# Patient Record
Sex: Female | Born: 1975
Health system: Southern US, Community
[De-identification: ages and names within clinical notes are randomized; demographics above are authoritative.]

## PROBLEM LIST (undated history)

## (undated) ENCOUNTER — Ambulatory Visit: Source: Home / Self Care

## (undated) DIAGNOSIS — N879 Dysplasia of cervix uteri, unspecified: Secondary | ICD-10-CM

## (undated) DIAGNOSIS — J45909 Unspecified asthma, uncomplicated: Secondary | ICD-10-CM

## (undated) DIAGNOSIS — R87629 Unspecified abnormal cytological findings in specimens from vagina: Secondary | ICD-10-CM

## (undated) HISTORY — PX: LEEP: SHX91

## (undated) HISTORY — DX: Unspecified abnormal cytological findings in specimens from vagina: R87.629

## (undated) HISTORY — DX: Dysplasia of cervix uteri, unspecified: N87.9

---

## 2010-04-24 ENCOUNTER — Inpatient Hospital Stay (HOSPITAL_COMMUNITY)
Admission: AD | Admit: 2010-04-24 | Discharge: 2010-04-24 | Disposition: A | Discharge status: Home / Self Care | Payer: Self-pay | Source: Ambulatory Visit | Attending: Obstetrics and Gynecology | Admitting: Obstetrics and Gynecology

## 2010-04-24 DIAGNOSIS — J069 Acute upper respiratory infection, unspecified: Secondary | ICD-10-CM

## 2010-04-24 DIAGNOSIS — O9989 Other specified diseases and conditions complicating pregnancy, childbirth and the puerperium: Secondary | ICD-10-CM

## 2010-04-24 DIAGNOSIS — O99891 Other specified diseases and conditions complicating pregnancy: Secondary | ICD-10-CM | POA: Insufficient documentation

## 2010-04-24 DIAGNOSIS — R109 Unspecified abdominal pain: Secondary | ICD-10-CM

## 2010-04-24 LAB — URINALYSIS, ROUTINE W REFLEX MICROSCOPIC
Bilirubin Urine: NEGATIVE
Ketones, ur: NEGATIVE mg/dL
Protein, ur: NEGATIVE mg/dL
Urine Glucose, Fasting: NEGATIVE mg/dL

## 2010-05-08 ENCOUNTER — Other Ambulatory Visit: Payer: Self-pay | Admitting: Family Medicine

## 2010-05-08 DIAGNOSIS — Z3689 Encounter for other specified antenatal screening: Secondary | ICD-10-CM

## 2010-05-09 ENCOUNTER — Ambulatory Visit (HOSPITAL_COMMUNITY)
Admission: RE | Admit: 2010-05-09 | Discharge: 2010-05-09 | Disposition: A | Discharge status: Home / Self Care | Payer: Self-pay | Source: Ambulatory Visit | Attending: Family Medicine | Admitting: Family Medicine

## 2010-05-09 DIAGNOSIS — O0930 Supervision of pregnancy with insufficient antenatal care, unspecified trimester: Secondary | ICD-10-CM | POA: Insufficient documentation

## 2010-05-09 DIAGNOSIS — Z3689 Encounter for other specified antenatal screening: Secondary | ICD-10-CM | POA: Insufficient documentation

## 2010-06-05 ENCOUNTER — Other Ambulatory Visit: Payer: Self-pay | Admitting: Family Medicine

## 2010-06-05 DIAGNOSIS — Z1389 Encounter for screening for other disorder: Secondary | ICD-10-CM

## 2010-06-06 ENCOUNTER — Ambulatory Visit (HOSPITAL_COMMUNITY)
Admission: RE | Admit: 2010-06-06 | Discharge: 2010-06-06 | Disposition: A | Discharge status: Home / Self Care | Payer: Medicaid Other | Source: Ambulatory Visit | Attending: Family Medicine | Admitting: Family Medicine

## 2010-06-06 DIAGNOSIS — Z3689 Encounter for other specified antenatal screening: Secondary | ICD-10-CM | POA: Insufficient documentation

## 2010-06-06 DIAGNOSIS — Z1389 Encounter for screening for other disorder: Secondary | ICD-10-CM

## 2010-06-06 DIAGNOSIS — O365990 Maternal care for other known or suspected poor fetal growth, unspecified trimester, not applicable or unspecified: Secondary | ICD-10-CM | POA: Insufficient documentation

## 2010-08-30 ENCOUNTER — Other Ambulatory Visit: Payer: Self-pay | Admitting: Family Medicine

## 2010-08-30 DIAGNOSIS — O480 Post-term pregnancy: Secondary | ICD-10-CM

## 2010-09-03 ENCOUNTER — Other Ambulatory Visit (HOSPITAL_COMMUNITY): Payer: Medicaid Other

## 2010-09-03 ENCOUNTER — Ambulatory Visit (HOSPITAL_COMMUNITY)
Admission: RE | Admit: 2010-09-03 | Discharge: 2010-09-03 | Disposition: A | Discharge status: Home / Self Care | Payer: Self-pay | Source: Ambulatory Visit | Attending: Family Medicine | Admitting: Family Medicine

## 2010-09-03 DIAGNOSIS — Z3689 Encounter for other specified antenatal screening: Secondary | ICD-10-CM | POA: Insufficient documentation

## 2010-09-03 DIAGNOSIS — O480 Post-term pregnancy: Secondary | ICD-10-CM | POA: Insufficient documentation

## 2010-09-04 ENCOUNTER — Inpatient Hospital Stay (HOSPITAL_COMMUNITY)
Admission: AD | Admit: 2010-09-04 | Discharge: 2010-09-06 | DRG: 775 | Disposition: A | Discharge status: Home / Self Care | Payer: Medicaid Other | Source: Ambulatory Visit | Attending: Family Medicine | Admitting: Family Medicine

## 2010-09-04 LAB — RPR: RPR Ser Ql: NONREACTIVE

## 2010-09-04 LAB — CBC
HCT: 36.7 % (ref 36.0–46.0)
Hemoglobin: 12.7 g/dL (ref 12.0–15.0)
MCV: 83.6 fL (ref 78.0–100.0)
RBC: 4.39 MIL/uL (ref 3.87–5.11)
WBC: 12.5 10*3/uL — ABNORMAL HIGH (ref 4.0–10.5)

## 2010-09-22 ENCOUNTER — Inpatient Hospital Stay (HOSPITAL_COMMUNITY): Admission: AD | Admit: 2010-09-22 | Payer: Self-pay | Source: Ambulatory Visit | Admitting: Obstetrics & Gynecology

## 2013-07-01 ENCOUNTER — Encounter: Payer: Self-pay | Admitting: *Deleted

## 2013-07-29 ENCOUNTER — Ambulatory Visit (INDEPENDENT_AMBULATORY_CARE_PROVIDER_SITE_OTHER): Payer: Self-pay | Admitting: Family Medicine

## 2013-07-29 ENCOUNTER — Encounter: Payer: Self-pay | Admitting: Family Medicine

## 2013-07-29 ENCOUNTER — Encounter: Payer: Medicaid Other | Admitting: Family Medicine

## 2013-07-29 ENCOUNTER — Other Ambulatory Visit (HOSPITAL_COMMUNITY)
Admission: RE | Admit: 2013-07-29 | Discharge: 2013-07-29 | Disposition: A | Discharge status: Home / Self Care | Payer: Self-pay | Source: Ambulatory Visit | Attending: Family Medicine | Admitting: Family Medicine

## 2013-07-29 VITALS — BP 107/68 | HR 72 | Temp 97.4°F | Ht 60.0 in | Wt 141.0 lb

## 2013-07-29 DIAGNOSIS — Z3202 Encounter for pregnancy test, result negative: Secondary | ICD-10-CM | POA: Insufficient documentation

## 2013-07-29 DIAGNOSIS — N871 Moderate cervical dysplasia: Secondary | ICD-10-CM | POA: Insufficient documentation

## 2013-07-29 LAB — POCT PREGNANCY, URINE: Preg Test, Ur: NEGATIVE

## 2013-07-29 NOTE — Patient Instructions (Signed)
Conizacin del cuello uterino - Cuidados posteriores (Conization of the Cervix, Care After) Siga estas instrucciones durante las prximas semanas. Estas indicaciones le proporcionan informacin general acerca de cmo deber cuidarse despus del procedimiento. El mdico tambin podr darle instrucciones ms especficas. El tratamiento se ha planificado de acuerdo a las prcticas mdicas actuales, pero a veces se producen problemas. Comunquese con el mdico si tiene algn problema o tiene dudas despus del procedimiento. QU ESPERAR DESPUS DEL PROCEDIMIENTO Despus del procedimiento, es tpico tener las siguientes sensaciones:  Si le han administrado anestesia general se sentir mareada durante 2-3 horas despus del procedimiento.  Podr sentir clicos (similar a los Tree surgeon) durante aproximadamente 1 semana.   El sangrado o la hemorragia vaginal pueden durar entre 1 y 2 semanas. El sangrado no debe ser abundante (por ejemplo no debe empapar un apsito en menos de 1 hora).  Podr tener una secrecin vaginal oscura, similar a la borra del caf. Es la pasta que le han aplicado en el cuello del tero para controlar el sangrado. Esto es normal. La recuperacin puede demorar hasta 3 semanas.  INSTRUCCIONES PARA EL CUIDADO EN EL HOGAR   Pdale a alguna persona que la lleve a su casa luego del procedimiento.  Tome todos los [employer] como le indic el mdico.No tome aspirina. Puede ocasionar hemorragias.   Durante la primera semana tome duchas. No tome baos de inmersin, no practique natacin ni use el jacuzzi hasta que el mdico la autorice.   No utilice tampones, duchas vaginales ni tenga relaciones sexuales hasta que el profesional la autorice.   Evite las actividades extenuantes y la prctica de ejercicios durante al menos 7 a 14 das.  Podr volver a su dieta normal, excepto que el mdico le indique otra cosa.    Si est constipada podr:   Tomar un laxante suave  segn las indicaciones del mdico.   Agregar frutas y salvado a su dieta.   Debe ingerir gran cantidad de lquido para mantener la orina de tono claro o color amarillo plido.  Cumpla con todas las visitas de control, segn le indique su mdico. SOLICITE ATENCIN MDICA SI:   Aparece una erupcin cutnea.   Se siente mareada o sufre un desmayo.   Siente nuseas.   Tiene una secrecin vaginal con mal olor. SOLICITE ATENCIN MDICA DE INMEDIATO SI:   Observa cogulos sanguneos o una hemorragia ms abundante que un periodo menstrual normal (por ejemplo, si empapa un apsito en menos de 1 hora) o si la hemorragia es de color rojo brillante.   Tiene fiebre de ms de 101F (38.3C) o sntomas persistentes durante ms de 2 - 3 das.   Tiene fiebre de ms de 101F (38.3C) y los sntomas empeoran repentinamente.  Siente cada vez ms clicos.   Se desmaya.   Siente dolor al [employer].  La orina tiene [place].   Comienza a vomitar.   El dolor no se alivia con los [employer].   El dolor es intenso o [place]. Document Released: 10/27/2012 [facility] Patient Information 2014 [place], [place].

## 2013-07-29 NOTE — Progress Notes (Addendum)
GYNECOLOGY CLINIC LEEP PROCEDURE NOTE  Pap smear and colposcopy reviewed.   Pap [facility] Colpo Biopsy 12,2,6 all with CIN II ECC neg  Risks, benefits, alternatives, and limitations of procedure explained to patient, including pain, bleeding, infection, failure to remove abnormal tissue and failure to cure dysplasia, need for repeat procedures, damage to pelvic organs, cervical incompetence.  Role of HPV,cervical dysplasia and need for close followup was empasized. Informed written consent was obtained. All questions were answered. Time out performed.   ??Procedure: The patient was placed in lithotomy position and the bivalved coated speculum was placed in the patient's vagina. A grounding pad placed on the patient. Lugol's solution was applied to the cervix and areas of decreased uptake were noted around the transformation zone.   Local anesthesia was administered via an intracervical block using 10cc of 2% Lidocaine with epinephrine. The suction was turned on and the Medium 1X Fisher Cone Biopsy Excisor on 50 Watts of cutting current was used to excise the area of decreased uptake and excise the entire transformation zone. Of note, the pt moved during the procedure and thus the excision was taken out in 2 pieces with an additional section removed on the anterior edge. Excellent hemostasis was achieved using roller ball coagulation set at 50 Watts coagulation current. Monsel's solution was then applied and the speculum was removed from the vagina. Specimens were sent to pathology.  ?The patient tolerated the procedure well. Post-operative instructions given to patient, including instruction to seek medical attention for persistent bright red bleeding, fever, abdominal/pelvic pain, dysuria, nausea or vomiting. She was also told about the possibility of having copious yellow to black tinged discharge for weeks. She was counseled to avoid anything in the vagina (sex/douching/tampons) for 3 weeks. She has a 4  week post-operative check to assess wound healing, review results and discuss further management.   Vale Haven, MD

## 2013-08-04 ENCOUNTER — Encounter: Payer: Self-pay | Admitting: *Deleted

## 2013-08-05 ENCOUNTER — Telehealth: Payer: Self-pay | Admitting: *Deleted

## 2013-08-05 ENCOUNTER — Encounter: Payer: Self-pay | Admitting: *Deleted

## 2013-08-05 NOTE — Telephone Encounter (Signed)
Called patient with aide of interpreter Leah Fernandez, no answer, mobile phone number is incorrect number, unable to leave message on voicemail.  Will send letter.  Letter sent.

## 2013-08-05 NOTE — Telephone Encounter (Signed)
Message copied by Dorothyann Peng on Fri Aug 05, 2013  9:53 AM ------      Message from: Vale Haven      Created: Thu Aug 04, 2013  4:17 PM       Pt needs repeat cytology with ECC in 4-6 months as CIN 2 was still at the margin. ------

## 2013-08-23 ENCOUNTER — Encounter: Payer: Self-pay | Admitting: General Practice

## 2013-08-26 ENCOUNTER — Ambulatory Visit (INDEPENDENT_AMBULATORY_CARE_PROVIDER_SITE_OTHER): Payer: Self-pay | Admitting: Family Medicine

## 2013-08-26 ENCOUNTER — Encounter: Payer: Self-pay | Admitting: Family Medicine

## 2013-08-26 VITALS — BP 97/60 | HR 61 | Temp 98.6°F | Ht 61.0 in | Wt 140.0 lb

## 2013-08-26 DIAGNOSIS — N871 Moderate cervical dysplasia: Secondary | ICD-10-CM

## 2013-08-26 NOTE — Progress Notes (Signed)
S:  38 yo [id number] who is here for a f/u from a LEEP.   Has been doing well. Bleeding and discharge stopped but then recently restarted. Wondering if it is from the nexplanon instead of the LEEP.  No fevers, chills, nausea, abd pain.    Reviewed results with pt- CIN 2 still at the margins. Due to this, will need repeat colposcopy with ECC in 4-6 months. This was discussed with the pt.  She will call in September for appt.    O: Filed Vitals:   08/26/13 0930  BP: 97/60  Pulse: 61  Temp: 98.6 F (37 C)  TempSrc: Oral  Height: 5\' 1"  (1.549 m)  Weight: 140 lb (63.504 kg)   GEN: well appearing.  GU: overall well healed LEEP site. Scant amount of dark red bleeding from the OS but well healed surrounding mucosa  A/P CIN II (cervical intraepithelial neoplasia II)  - doing well post leep - well healed.  - bleeding likely due to nexplanon and not from LEEP  - ok to resume intercourse - discussed results as above and pt to call and schedule repeat colpo with ECC. Message sent to front desk as well as a reminder.    BECK, Redmond BasemanKELI L, MD

## 2013-08-26 NOTE — Patient Instructions (Signed)
Procedimiento de escisin electroquirrgica con asa - Cuidados posteriores  (Loop Electrosurgical Excision Procedure, Care After) Siga estas instrucciones durante las prximas semanas. Estas indicaciones le proporcionan informacin general acerca de cmo deber cuidarse despus del procedimiento. El mdico tambin podr darle instrucciones especficas. El tratamiento ha sido planificado segn las prcticas mdicas actuales, pero en algunos casos pueden ocurrir problemas. Comunquese con el mdico si tiene algn problema o tiene preguntas despus del procedimiento.  INSTRUCCIONES PARA EL CUIDADO EN EL HOGAR   No use tampones, no se d duchas vaginales ni tenga relaciones sexuales durante 2 semanas, o segn lo que le indique su mdico.  Comience con las actividades habituales si no tiene o tiene mnimo de clicos y sangrado, excepto que el mdico le indique lo contrario.  Tmese la temperatura si se siente enfermo. Anote la temperatura en un papel e informe a su mdico que tiene fiebre.  Tome todos los medicamentos segn le indic su mdico.  Cumpla con todas las visitas de control y los papanicolau, segn le indique su mdico. SOLICITE ATENCIN MDICA DE INMEDIATO SI:   Tiene un sangrado ms abundante o que dura ms que el ciclo menstrual normal.  Tiene un sangrado de color rojo brillante.  Elimina cogulos de sangre.  Tiene fiebre.  Siente clicos o el dolor no se alivia con la medicacin.  Siente dolor abdominal que no parece estar relacionado con la misma zona en que sinti los clicos y el dolor.  Se siente mareada, dbil o se desmaya.  Comienza a sentir dolor al orinar u observa sangre.  Tiene una secrecin vaginal con mal olor. ASEGRESE DE QUE:   Comprende estas instrucciones.  Controlar su enfermedad.  Solicitar ayuda de inmediato si no mejora o si empeora. Document Released: 11/07/2010 Document Revised: 05/19/2011 ExitCare Patient Information 2015 ExitCare, LLC.  This information is not intended to replace advice given to you by your health care provider. Make sure you discuss any questions you have with your health care provider.  

## 2013-09-02 ENCOUNTER — Encounter: Payer: Self-pay | Admitting: Obstetrics & Gynecology

## 2013-09-05 ENCOUNTER — Encounter: Payer: Self-pay | Admitting: General Practice

## 2013-11-07 ENCOUNTER — Encounter: Payer: Self-pay | Admitting: Obstetrics & Gynecology

## 2013-11-07 ENCOUNTER — Ambulatory Visit (INDEPENDENT_AMBULATORY_CARE_PROVIDER_SITE_OTHER): Payer: Self-pay | Admitting: Obstetrics & Gynecology

## 2013-11-07 VITALS — BP 108/69 | HR 63 | Wt 138.3 lb

## 2013-11-07 DIAGNOSIS — N879 Dysplasia of cervix uteri, unspecified: Secondary | ICD-10-CM

## 2013-11-07 NOTE — Progress Notes (Signed)
Patient ID: Leah Fernandez, female   DOB: Jan 11, 1976, 38 y.o.   MRN: [id number] Pt with High grade dysplasia s/p LEEP with positive margins.  Needs repeat PAP 6 months from prior study.  Exam deferred.

## 2013-11-07 NOTE — Patient Instructions (Signed)
Displasia cervical (Cervical Dysplasia) La displasia cervical es una afeccin que ocurre cuando una mujer presenta cambios anormales en las clulas del cuello del tero. El cuello del tero es la abertura del tero. Se encuentra entre la vagina y Nurse, learning disability. La displasia cervical puede ser el primer signo de cncer de cuello del tero.  Casi todos los casos de displasia cervical pueden curarse gracias a la deteccin temprana, el tratamiento y controles rigurosos. Si no se la trata, puede agravarse.  CAUSAS  Una infeccin causada por el virus del Engineer, technical sales (VPH) puede provocar la displasia cervical. FACTORES DE RIESGO   Haber padecido una enfermedad de transmisin sexual, como clamidia o infeccin por el VPH.  Ser sexualmente activa antes de los 18 aos.  Haber tenido ms de 1 compaero sexual.  No usar proteccin [employer] sexuales, especialmente con compaeros sexuales nuevos.  Haber sufrido cncer en la vagina o en la vulva.  Haber tenido un compaero sexual cuya pareja anterior padeci cncer de cuello del tero o displasia cervical.  Tener un compaero sexual que tiene o ha tenido cncer de pene.  Presentar un debilitamiento del sistema inmunitario (a causa del VIH o de un trasplante de rgano).  Ser hija de una mujer que tom dietilestilbestrol (DES) durante el embarazo.  Tener antecedentes familiares de cncer de cuello del tero.  El hbito de fumar. SIGNOS Y SNTOMAS  Generalmente, no se presentan sntomas. Si hay sntomas, estos pueden incluir:   Flujo vaginal anormal.  Sangrado entre perodos menstruales o despus de Retail banker.  Sangrado durante la menopausia.  [place] (dispareunia). DIAGNSTICO  Se puede realizar una prueba de Papanicolaou. Durante esta prueba, se extraen clulas del cuello del tero y luego se analizan con un microscopio. Tambin puede realizarse una prueba en la que se extirpa  tejido del cuello del tero (biopsia), si la prueba de Papanicolaou es anormal o si el aspecto del cuello del tero es anormal.  TRATAMIENTO  El tratamiento vara en funcin de la gravedad de la displasia cervical. El tratamiento puede incluir:  Crioterapia. Durante la crioterapia, las clulas anormales se congelan con un instrumento con punta de acero.  Un procedimiento para extirpar tejido anormal del cuello del tero.  Ciruga para extirpar tejido anormal. Generalmente, se lleva a cabo en casos graves de displasia cervical. Las opciones quirrgicas son:  Biopsia en cono. Este es un procedimiento en el que se extirpan el canal cervical y Ardelia Mems parte del centro del cuello del tero.  Histerectoma En esta ciruga se extirpan el tero y el cuello del tero. INSTRUCCIONES PARA EL CUIDADO EN EL HOGAR   Slo tome medicamentos de venta libre o recetados para Glass blower/designer o Health and safety inspector, segn las indicaciones de su mdico.  No utilice tampones, duchas vaginales ni tenga relaciones sexuales hasta que el profesional la autorice.  Cumpla con todas las visitas de control, segn le indique su mdico. Las mujeres que han recibido tratamiento para la displasia cervical deberan someterse a pruebas de Papanicolaou y exmenes plvicos de forma regular. Durante el primer ao despus del tratamiento de la displasia cervical, la prueba de Papanicolaou se debe realizar cada 3 a 4 meses. En el segundo ao, se debe realizar cada 6 meses o segn lo recomendado por el mdico.  Para evitar la recurrencia de la afeccin, practique el sexo seguro. SOLICITE ATENCIN MDICA SI:  Presenta verrugas genitales.  SOLICITE ATENCIN MDICA DE INMEDIATO SI:   El perodo menstrual  es ms abundante que lo normal.  Presenta un sangrado rojo brillante, especialmente si tiene cogulos sanguneos.  Tiene fiebre.  Siente clicos o dolor que aumenta y no se alivia con medicamentos.  Se siente mareada, inusualmente dbil o  se desmaya.  Tiene flujo vaginal anormal.  Siente dolor abdominal. Document Released: 12/15/2012 ExitCare Patient Information 2015 ExitCare, LLC. This information is not intended to replace advice given to you by your health care provider. Make sure you discuss any questions you have with your health care provider.  

## 2014-01-02 ENCOUNTER — Encounter: Payer: Self-pay | Admitting: Obstetrics & Gynecology

## 2014-01-02 ENCOUNTER — Ambulatory Visit (INDEPENDENT_AMBULATORY_CARE_PROVIDER_SITE_OTHER): Payer: Self-pay | Admitting: Obstetrics & Gynecology

## 2014-01-02 VITALS — BP 109/66 | HR 67 | Temp 97.9°F | Ht 61.0 in | Wt 139.2 lb

## 2014-01-02 DIAGNOSIS — N879 Dysplasia of cervix uteri, unspecified: Secondary | ICD-10-CM

## 2014-01-02 NOTE — Progress Notes (Signed)
Subjective:     Patient ID: Leah Fernandez, female   DOB: [birth year], 38 y.o.   MRN: [id number]  HPI Pt with a h/o of LEEP in May with + margins.  She is here now for f/u.  She denies GYN complaints. However, her sister was recently dx'd with breast cancer and she is worried about her risk.   Review of Systems     Objective:   Physical Exam BP 109/66  Pulse 67  Temp(Src) 97.9 F (36.6 C)  Ht 5\' 1"  (1.549 m)  Wt 139 lb 3.2 oz (63.141 kg)  BMI 26.32 kg/m2  LMP 12/19/2013 GU: EGBUS: no lesions Vagina: no blood in vault Cervix: no lesion; no mucopurulent d/c; well healed    07/29/2013 Diagnosis Cervix, LEEP - HIGH GRADE SQUAMOUS INTRAEPITHELIAL LESION, CIN-II (MODERATE DYSPLASIA). - HIGH GRADE SQUAMOUS DYSPLASIA IS PRESENT AT NON-ORIENTED INKED TISSUE EDGE(S). - SEE COMMENT.     Assessment:     HGSIL with positive margins. For repeat PAP today  Reviewed the absence of relationship with breast and cervical CA but, recommended screening mammogram     Plan:     PAP obtained/ f/u [facility]

## 2014-01-02 NOTE — Patient Instructions (Signed)
Mamografa (Mammography) La mamografa es un tipo de radiografa de las mamas que se realiza para observar si hay cambios que no son normales. Este tipo de radiografa se denomina mamografa. Con este procedimiento se puede estudiar el cncer de mama, detectarlo de Leonarda Salonmanera temprana, y diagnosticar cncer.  INFORME A SU MDICO SOBRE:   Implantes mamarios.  Enfermedades, biopsias o cirugas previas de la Burnsidemama.  Si est amamantando.  Medicamentos que [place]utiliza, incluyendo vitaminas, hierbas, gotas oftlmicas, medicamentos de venta libre y cremas.  Uso de esteroides (por va oral o cremas).  Posibilidad de embarazo, si correspondiera. RIESGOS Y COMPLICACIONES   Exposicin a la radiacin, pero a niveles muy bajos.  Los resultados pueden estar mal interpretados.  Los Cendant Corporationresultados pueden no ser precisos.  La mamografa puede conducir a Lawyerpruebas adicionales.  Puede ser que no detecte ciertos tipos de cncer. ANTES DEL PROCEDIMIENTO  Programe su prueba para aproximadamente 7 das despus de tener su perodo menstrual. En este momento las mamas estn menos sensibles y hay signos de cambios hormonales.  Si usted se ha PPG Industrieshecho una mamografa en un establecimiento diferente en el pasado, trate de conseguirla o que la enven al nuevo establecimiento con el fin de compararlas.  El da del examen, lave sus mamas y la zona de las Moundsaxilas.  No use desodorante, perfume o talco en ningn lugar de su cuerpo.  Use prendas que pueda ponerse y sacarse fcilmente. PROCEDIMIENTO Durante el procedimiento reljese Walgreentanto como le sea posible. Cualquier molestia durante la prueba ser muy leve. La ecografa demora menos de 30 minutos. Esto ocurrir:   Tendr que desvestirse de la cintura para arriba y ponerse una bata de hospital.  Se pondr de pie delante de la mquina de rayos-X.  Se coloca cada mama entre dos placas de vidrio o plstico. Las placas comprimirn los senos durante unos segundos.  Se tomar  una radiografa en diferentes ngulos de la mama. . DESPUS DEL PROCEDIMIENTO  La mamografa ser examinada.  Dependiendo de la calidad de las imgenes, es posible que tenga que repetir ciertas partes de la prueba.  Consulte con su mdico la fecha en que los resultados estarn disponibles. Asegrese de Starbucks Corporationobtener los resultados.  Puede retomar sus actividades habituales. Document Released: 12/04/2004 Document Revised: 05/19/2011 [facility] Patient Information 2015 [place], [place]. This information is not intended to replace advice given to you by your health care provider. Make sure you discuss any questions you have with your health care provider. Displasia cervical (Cervical Dysplasia) La displasia cervical es una afeccin que ocurre cuando una mujer presenta cambios anormales en las clulas del cuello del tero. El cuello del tero es la abertura del tero. Se encuentra entre la vagina y Careers information officerel tero. La displasia cervical puede ser el primer signo de cncer de cuello del tero.  Casi todos los casos de displasia cervical pueden curarse gracias a la deteccin temprana, el tratamiento y controles rigurosos. Si no se la trata, puede agravarse.  CAUSAS  Una infeccin causada por el virus del Geneticist, molecularpapiloma humano (VPH) puede provocar la displasia cervical. FACTORES DE RIESGO   Haber padecido una enfermedad de transmisin sexual, como clamidia o infeccin por el VPH.  Ser sexualmente activa antes de los 18 aos.  Haber tenido ms de 1 compaero sexual.  No usar proteccin [place] Corporationdurante las relaciones sexuales, especialmente con compaeros sexuales nuevos.  Haber sufrido cncer en la vagina o en la vulva.  Haber tenido un compaero sexual cuya pareja anterior padeci cncer de cuello del tero o  displasia cervical.  Tener un compaero sexual que tiene o ha tenido cncer de pene.  Presentar un debilitamiento del sistema inmunitario (a causa del VIH o de un trasplante de rgano).  Ser hija de una mujer  que tom dietilestilbestrol (DES) durante el embarazo.  Tener antecedentes familiares de cncer de cuello del tero.  El hbito de fumar. SIGNOS Y SNTOMAS  Generalmente, no se presentan sntomas. Si hay sntomas, estos pueden incluir:   Flujo vaginal anormal.  Sangrado entre perodos menstruales o despus de mantener relaciones sexuales.  Sangrado durante la menopausia.  Dolor durante las relaciones sexuales (dispaSales promotion account executivereunia). DIAGNSTICO  Se puede realizar una prueba de Papanicolaou. Durante esta prueba, se extraen clulas del cuello del tero y luego se analizan con un microscopio. Tambin puede realizarse una prueba en la que se extirpa tejido del cuello del tero (biopsia), si la prueba de Papanicolaou es anormal o si el aspecto del cuello del tero es anormal.  TRATAMIENTO  El tratamiento vara en funcin de la gravedad de la displasia cervical. El tratamiento puede incluir:  Crioterapia. Durante la crioterapia, las clulas anormales se congelan con un instrumento con punta de acero.  Un procedimiento para extirpar tejido anormal del cuello del tero.  Ciruga para extirpar tejido anormal. Generalmente, se lleva a cabo en casos graves de displasia cervical. Las opciones quirrgicas son:  Biopsia en cono. Este es un procedimiento en el que se extirpan el canal cervical y Neomia Dearuna parte del centro del cuello del tero.  Histerectoma En esta ciruga se extirpan el tero y el cuello del tero. INSTRUCCIONES PARA EL CUIDADO EN EL HOGAR   Slo tome medicamentos de venta libre o recetados para Primary school teachercalmar el dolor o Environmental health practitionerel malestar, segn las indicaciones de su mdico.  No utilice tampones, duchas vaginales ni tenga relaciones sexuales hasta que el profesional la autorice.  Cumpla con todas las visitas de control, segn le indique su mdico. Las mujeres que han recibido tratamiento para la displasia cervical deberan someterse a pruebas de Papanicolaou y exmenes plvicos de forma regular. Durante  el primer ao despus del tratamiento de la displasia cervical, la prueba de Papanicolaou se debe realizar cada 3 a 4 meses. En el segundo ao, se debe realizar cada 6 meses o segn lo recomendado por el mdico.  Para evitar la recurrencia de la afeccin, practique el sexo seguro. SOLICITE ATENCIN MDICA SI:  Presenta verrugas genitales.  SOLICITE ATENCIN MDICA DE INMEDIATO SI:   El perodo menstrual es ms abundante que lo normal.  Presenta un sangrado rojo brillante, especialmente si tiene cogulos sanguneos.  Tiene fiebre.  Siente clicos o dolor que Lesothoaumenta y no se alivia con medicamentos.  Se siente mareada, inusualmente dbil o se desmaya.  Tiene flujo vaginal anormal.  Siente dolor abdominal. Document Released: 12/15/2012 [facility] Patient Information 2015 [place]ExitCare, [place]. This information is not intended to replace advice given to you by your health care provider. Make sure you discuss any questions you have with your health care provider.

## 2014-01-03 LAB — CYTOLOGY - PAP

## 2014-01-05 ENCOUNTER — Other Ambulatory Visit (HOSPITAL_COMMUNITY): Payer: Self-pay | Admitting: Nurse Practitioner

## 2014-01-05 DIAGNOSIS — Z1231 Encounter for screening mammogram for malignant neoplasm of breast: Secondary | ICD-10-CM

## 2014-01-09 ENCOUNTER — Encounter: Payer: Self-pay | Admitting: Obstetrics & Gynecology

## 2014-01-20 ENCOUNTER — Ambulatory Visit (HOSPITAL_COMMUNITY): Payer: Self-pay | Attending: Nurse Practitioner

## 2014-04-21 ENCOUNTER — Encounter (HOSPITAL_COMMUNITY): Payer: Self-pay

## 2014-04-21 ENCOUNTER — Emergency Department (HOSPITAL_COMMUNITY)
Admission: EM | Admit: 2014-04-21 | Discharge: 2014-04-21 | Disposition: A | Discharge status: Home / Self Care | Payer: Self-pay | Attending: Emergency Medicine | Admitting: Emergency Medicine

## 2014-04-21 DIAGNOSIS — Z79899 Other long term (current) drug therapy: Secondary | ICD-10-CM | POA: Insufficient documentation

## 2014-04-21 DIAGNOSIS — J039 Acute tonsillitis, unspecified: Secondary | ICD-10-CM | POA: Insufficient documentation

## 2014-04-21 DIAGNOSIS — H9209 Otalgia, unspecified ear: Secondary | ICD-10-CM | POA: Insufficient documentation

## 2014-04-21 MED ORDER — AMOXICILLIN 500 MG PO CAPS: 500.0000 mg | ORAL_CAPSULE | Freq: Three times a day (TID) | ORAL | Status: DC

## 2014-04-21 MED ORDER — HYDROCODONE-ACETAMINOPHEN 5-325 MG PO TABS: 2.0000 | ORAL_TABLET | ORAL | Status: DC | PRN

## 2014-04-21 NOTE — Discharge Instructions (Signed)
Faringitis (Pharyngitis) La faringitis es el dolor de garganta (faringe). La garganta presenta enrojecimiento, hinchazn y dolor. CUIDADOS EN EL HOGAR   Beba suficiente lquido para mantener la orina clara o de color amarillo plido.  Solo tome los medicamentos que le haya indicado su mdico.  Si no toma los medicamentos segn las indicaciones podra volver a enfermarse. Finalice la prescripcin completa, aunque comience a sentirse mejor.  No tome aspirina.  Reposo.  Enjuguese la boca (hacer grgaras) con agua y sal (cucharadita de sal por litro de agua) cada 1 o 2horas. Esto ayudar a aliviar el dolor.  Si no corre riesgo de ahogarse, puede chupar un caramelo duro o pastillas para la garganta. SOLICITE AYUDA SI:  Tiene bultos grandes y dolorosos al tacto en el cuello.  Tiene una erupcin cutnea.  Cuando tose elimina una expectoracin verde, amarillo amarronado o con sangre. SOLICITE AYUDA DE INMEDIATO SI:   Presenta rigidez en el cuello.  Babea o no puede tragar lquidos.  Vomita o no puede retener los medicamentos ni los lquidos.  Siente un dolor intenso que no se alivia con medicamentos.  Tiene problemas para respirar (y no debido a la nariz tapada). ASEGRESE DE QUE:   Comprende estas instrucciones.  Controlar su afeccin.  Recibir ayuda de inmediato si no mejora o si empeora. Document Released: 05/23/2008 Document Revised: 12/15/2012 ExitCare Patient Information 2015 ExitCare, LLC. This information is not intended to replace advice given to you by your health care provider. Make sure you discuss any questions you have with your health care provider.  

## 2014-04-21 NOTE — ED Notes (Signed)
Pt c/o sore throat, body aches, and ear pain.

## 2014-04-21 NOTE — ED Notes (Signed)
Pt started 4 days ago with sore throat and body aches. Denies vomiting but states she feels nauseated.

## 2014-04-21 NOTE — ED Provider Notes (Signed)
CSN: [id number]     Arrival date & time 04/21/14  1442 History   First MD Initiated Contact with Patient 04/21/14 1600     Chief Complaint  Patient presents with  . Sore Throat  . Generalized Body Aches  . Otalgia     (Consider location/radiation/quality/duration/timing/severity/associated sxs/prior Treatment) Patient is a 39 y.o. female presenting with pharyngitis and ear pain. The history is provided by the patient. No language interpreter was used.  Sore Throat This is a new problem. Episode onset: 4 days. The problem occurs constantly. The problem has been gradually worsening. Associated symptoms include a sore throat. Nothing aggravates the symptoms. She has tried nothing for the symptoms. The treatment provided moderate relief.  Otalgia Associated symptoms: sore throat     Past Medical History  Diagnosis Date  . Vaginal Pap smear, abnormal    History reviewed. No pertinent past surgical history. Family History  Problem Relation Age of Onset  . Diabetes Mother    History  Substance Use Topics  . Smoking status: Never Smoker   . Smokeless tobacco: Never Used  . Alcohol Use: No   OB History    Gravida Para Term Preterm AB TAB SAB Ectopic Multiple Living   5 5 5       5      Review of Systems  HENT: Positive for ear pain and sore throat.   All other systems reviewed and are negative.     Allergies  Review of patient's allergies indicates no known allergies.  Home Medications   Prior to Admission medications   Medication Sig Start Date End Date Taking? Authorizing Provider  amoxicillin (AMOXIL) 500 MG capsule Take 1 capsule (500 mg total) by mouth 3 (three) times daily. 04/21/14   Elson AreasLeslie K Jacarius Handel, PA-C  cetirizine (ZYRTEC) 10 MG tablet Take 10 mg by mouth daily.    Historical Provider, MD  HYDROcodone-acetaminophen (NORCO/VICODIN) 5-325 MG per tablet Take 2 tablets by mouth every 4 (four) hours as needed. 04/21/14   Lonia SkinnerLeslie K Jarrel Knoke, PA-C   BP 100/78 mmHg  Pulse  96  Temp(Src) 98.4 F (36.9 C) (Oral)  Resp 16  Ht 5\' 1"  (1.549 m)  Wt 138 lb (62.596 kg)  BMI 26.09 kg/m2  SpO2 97%  LMP 04/07/2014 Physical Exam  Constitutional: She is oriented to person, place, and time. She appears well-developed and well-nourished.  HENT:  Head: Normocephalic and atraumatic.  Swollen red tonsils edematous,   Eyes: Conjunctivae and EOM are normal. Pupils are equal, round, and reactive to light.  Neck: Normal range of motion.  Pulmonary/Chest: Effort normal.  Abdominal: She exhibits no distension.  Musculoskeletal: Normal range of motion.  Neurological: She is alert and oriented to person, place, and time.  Psychiatric: She has a normal mood and affect.  Nursing note and vitals reviewed.   ED Course  Procedures (including critical care time) Labs Review Labs Reviewed - No data to display  Imaging Review No results found.   EKG Interpretation None      MDM   Final diagnoses:  Tonsillitis    amoxicillian Hydrocodone Recheck at Urgent care in 2 days if pain/symptoms persist    Elson AreasLeslie K Fabrice Dyal, PA-C 04/21/14 1610  Rolland PorterMark James, MD 04/22/14 2204

## 2014-06-28 ENCOUNTER — Encounter (HOSPITAL_COMMUNITY): Payer: Self-pay | Admitting: Emergency Medicine

## 2014-06-28 ENCOUNTER — Emergency Department (HOSPITAL_COMMUNITY)
Admission: EM | Admit: 2014-06-28 | Discharge: 2014-06-28 | Disposition: A | Discharge status: Home / Self Care | Payer: Self-pay | Attending: Emergency Medicine | Admitting: Emergency Medicine

## 2014-06-28 DIAGNOSIS — Z79899 Other long term (current) drug therapy: Secondary | ICD-10-CM | POA: Insufficient documentation

## 2014-06-28 DIAGNOSIS — K088 Other specified disorders of teeth and supporting structures: Secondary | ICD-10-CM | POA: Insufficient documentation

## 2014-06-28 DIAGNOSIS — K0889 Other specified disorders of teeth and supporting structures: Secondary | ICD-10-CM

## 2014-06-28 DIAGNOSIS — Z792 Long term (current) use of antibiotics: Secondary | ICD-10-CM | POA: Insufficient documentation

## 2014-06-28 MED ORDER — TRAMADOL HCL 50 MG PO TABS: 50.0000 mg | ORAL_TABLET | Freq: Four times a day (QID) | ORAL | Status: DC | PRN

## 2014-06-28 MED ORDER — PENICILLIN V POTASSIUM 500 MG PO TABS: 500.0000 mg | ORAL_TABLET | Freq: Four times a day (QID) | ORAL | Status: DC

## 2014-06-28 MED ORDER — IBUPROFEN 800 MG PO TABS: 800.0000 mg | ORAL_TABLET | Freq: Three times a day (TID) | ORAL | Status: DC | PRN

## 2014-06-28 MED ORDER — IBUPROFEN 400 MG PO TABS
800.0000 mg | ORAL_TABLET | Freq: Once | ORAL | Status: AC
  Administered 2014-06-28: 800 mg via ORAL
  Filled 2014-06-28: qty 2

## 2014-06-28 MED ORDER — OXYCODONE-ACETAMINOPHEN 5-325 MG PO TABS
1.0000 | ORAL_TABLET | Freq: Once | ORAL | Status: AC
Start: 2014-06-28 — End: 2014-06-28
  Administered 2014-06-28: 1 via ORAL
  Filled 2014-06-28: qty 1

## 2014-06-28 NOTE — Discharge Instructions (Signed)
Return here as needed. Follow up the dentist provided.

## 2014-06-28 NOTE — ED Notes (Signed)
Pt reports L lower dental pain x 4 days. Pain also in ear and head. No fevers.

## 2014-06-28 NOTE — ED Provider Notes (Signed)
CSN: [id number]     Arrival date & time 06/28/14  2030 History  This chart was scribed for non-physician practitioner, Charlestine Nighthristopher Artelia Game, PA-C, working with Glynn OctaveStephen Rancour, MD, by Bronson CurbJacqueline Melvin, ED Scribe. This patient was seen in room TR06C/TR06C and the patient's care was started at 8:44 PM.    Chief Complaint  Patient presents with  . Dental Pain    The history is provided by the patient. No language interpreter was used.     HPI Comments: Leah Fernandez is a 39 y.o. female who presents to the Emergency Department complaining of constant, moderate, left lower dental pain for the past 4 days. She has taken ibuprofen 1 hour ago. She denies fever. Patient is not established with a dentist.   Past Medical History  Diagnosis Date  . Vaginal Pap smear, abnormal    History reviewed. No pertinent past surgical history. Family History  Problem Relation Age of Onset  . Diabetes Mother    History  Substance Use Topics  . Smoking status: Never Smoker   . Smokeless tobacco: Never Used  . Alcohol Use: No   OB History    Gravida Para Term Preterm AB TAB SAB Ectopic Multiple Living   5 5 5       5      Review of Systems  Constitutional: Negative for fever.  HENT: Positive for dental problem.       Allergies  Review of patient's allergies indicates no known allergies.  Home Medications   Prior to Admission medications   Medication Sig Start Date End Date Taking? Authorizing Provider  amoxicillin (AMOXIL) 500 MG capsule Take 1 capsule (500 mg total) by mouth 3 (three) times daily. 04/21/14   Elson AreasLeslie K Sofia, PA-C  cetirizine (ZYRTEC) 10 MG tablet Take 10 mg by mouth daily.    Historical Provider, MD  HYDROcodone-acetaminophen (NORCO/VICODIN) 5-325 MG per tablet Take 2 tablets by mouth every 4 (four) hours as needed. 04/21/14   Elson AreasLeslie K Sofia, PA-C   Triage Vitals: BP 107/67 mmHg  Pulse 77  Temp(Src) 97.9 F (36.6 C) (Oral)  Resp 16  Ht 5\' 1"  (1.549 m)  Wt 142 lb 3.2  oz (64.501 kg)  BMI 26.88 kg/m2  SpO2 98%  LMP 06/18/2014  Physical Exam  Constitutional: She is oriented to person, place, and time. She appears well-developed and well-nourished. No distress.  HENT:  Head: Normocephalic and atraumatic.  Pain along lower jawline on the left. No gingival swelling.  Eyes: Conjunctivae and EOM are normal.  Neck: Neck supple. No tracheal deviation present.  Cardiovascular: Normal rate, regular rhythm and normal heart sounds.   Pulmonary/Chest: Effort normal and breath sounds normal. No respiratory distress.  Musculoskeletal: Normal range of motion.  Neurological: She is alert and oriented to person, place, and time.  Skin: Skin is warm and dry.  Psychiatric: She has a normal mood and affect. Her behavior is normal.  Nursing note and vitals reviewed.   ED Course  Procedures (including critical care time)  DIAGNOSTIC STUDIES: Oxygen Saturation is 98% on room air, normal by my interpretation.    COORDINATION OF CARE: At 2046 Discussed treatment plan with patient which includes ABX. Patient agrees.     I personally performed the services described in this documentation, which was scribed in my presence. The recorded information has been reviewed and is accurate.   Charlestine NightChristopher Tavia Stave, PA-C 07/01/14 [id number]  Glynn OctaveStephen Rancour, MD 07/01/14 [phone]

## 2016-01-25 ENCOUNTER — Other Ambulatory Visit: Payer: Self-pay | Admitting: Orthopedic Surgery

## 2016-01-25 DIAGNOSIS — M533 Sacrococcygeal disorders, not elsewhere classified: Secondary | ICD-10-CM

## 2016-02-15 ENCOUNTER — Ambulatory Visit
Admission: RE | Admit: 2016-02-15 | Discharge: 2016-02-15 | Disposition: A | Discharge status: Home / Self Care | Payer: Worker's Compensation | Source: Ambulatory Visit | Attending: Orthopedic Surgery | Admitting: Orthopedic Surgery

## 2016-02-15 ENCOUNTER — Other Ambulatory Visit: Payer: Self-pay

## 2016-02-15 DIAGNOSIS — M533 Sacrococcygeal disorders, not elsewhere classified: Secondary | ICD-10-CM

## 2016-04-21 ENCOUNTER — Encounter (HOSPITAL_COMMUNITY): Payer: Self-pay | Admitting: *Deleted

## 2016-04-21 ENCOUNTER — Emergency Department (HOSPITAL_COMMUNITY)
Admission: EM | Admit: 2016-04-21 | Discharge: 2016-04-21 | Disposition: A | Discharge status: Home / Self Care | Payer: Self-pay | Attending: Dermatology | Admitting: Dermatology

## 2016-04-21 DIAGNOSIS — Z79899 Other long term (current) drug therapy: Secondary | ICD-10-CM | POA: Insufficient documentation

## 2016-04-21 DIAGNOSIS — Z5321 Procedure and treatment not carried out due to patient leaving prior to being seen by health care provider: Secondary | ICD-10-CM | POA: Insufficient documentation

## 2016-04-21 DIAGNOSIS — K0889 Other specified disorders of teeth and supporting structures: Secondary | ICD-10-CM | POA: Insufficient documentation

## 2016-04-21 NOTE — ED Notes (Signed)
Pt called several times with no answer 

## 2016-04-21 NOTE — ED Triage Notes (Signed)
Pt has broken tooth on left lower jaw.  Tooth broke a month ago and today pain began and has been increasing over the course of the day.  Pt appears uncomfortable.  She has taken OTC pain medication without any success.

## 2016-06-17 ENCOUNTER — Other Ambulatory Visit: Payer: Self-pay | Admitting: Obstetrics & Gynecology

## 2016-06-17 DIAGNOSIS — Z1231 Encounter for screening mammogram for malignant neoplasm of breast: Secondary | ICD-10-CM

## 2018-11-03 ENCOUNTER — Encounter: Payer: Self-pay | Admitting: Obstetrics and Gynecology

## 2018-11-03 ENCOUNTER — Other Ambulatory Visit: Payer: Self-pay

## 2018-11-03 ENCOUNTER — Ambulatory Visit (INDEPENDENT_AMBULATORY_CARE_PROVIDER_SITE_OTHER): Payer: Medicaid Other | Admitting: Obstetrics and Gynecology

## 2018-11-03 VITALS — BP 108/60 | HR 82 | Wt 156.0 lb

## 2018-11-03 DIAGNOSIS — O0992 Supervision of high risk pregnancy, unspecified, second trimester: Secondary | ICD-10-CM | POA: Diagnosis not present

## 2018-11-03 DIAGNOSIS — N871 Moderate cervical dysplasia: Secondary | ICD-10-CM | POA: Diagnosis not present

## 2018-11-03 DIAGNOSIS — Z789 Other specified health status: Secondary | ICD-10-CM

## 2018-11-03 DIAGNOSIS — Z124 Encounter for screening for malignant neoplasm of cervix: Secondary | ICD-10-CM

## 2018-11-03 DIAGNOSIS — O3440 Maternal care for other abnormalities of cervix, unspecified trimester: Secondary | ICD-10-CM | POA: Insufficient documentation

## 2018-11-03 DIAGNOSIS — Z9889 Other specified postprocedural states: Secondary | ICD-10-CM | POA: Insufficient documentation

## 2018-11-03 DIAGNOSIS — Z113 Encounter for screening for infections with a predominantly sexual mode of transmission: Secondary | ICD-10-CM

## 2018-11-03 DIAGNOSIS — O09522 Supervision of elderly multigravida, second trimester: Secondary | ICD-10-CM | POA: Diagnosis not present

## 2018-11-03 DIAGNOSIS — Z3A15 15 weeks gestation of pregnancy: Secondary | ICD-10-CM | POA: Diagnosis not present

## 2018-11-03 DIAGNOSIS — O09529 Supervision of elderly multigravida, unspecified trimester: Secondary | ICD-10-CM | POA: Insufficient documentation

## 2018-11-03 DIAGNOSIS — Z1151 Encounter for screening for human papillomavirus (HPV): Secondary | ICD-10-CM

## 2018-11-03 DIAGNOSIS — Z641 Problems related to multiparity: Secondary | ICD-10-CM

## 2018-11-03 DIAGNOSIS — O0990 Supervision of high risk pregnancy, unspecified, unspecified trimester: Secondary | ICD-10-CM

## 2018-11-03 DIAGNOSIS — O3442 Maternal care for other abnormalities of cervix, second trimester: Secondary | ICD-10-CM

## 2018-11-03 MED ORDER — ASPIRIN EC 81 MG PO TBEC: 81.0000 mg | DELAYED_RELEASE_TABLET | Freq: Every day | ORAL | 2 refills | Status: DC

## 2018-11-03 NOTE — Progress Notes (Signed)
Scheduled [place] for anatomy for 11/29/18 at 11:15am.  Nieve Rojero,RN

## 2018-11-03 NOTE — Patient Instructions (Signed)
Call for any blood pressures above 140 for the top and/or above 90 for the bottom.

## 2018-11-03 NOTE — Progress Notes (Signed)
New OB Note  11/03/2018   [facility]  Chief Complaint: NOB  Transfer of Care Patient: no  History of Present Illness: Ms. Verne SpurrGarcia Fuentes is a 43 y.o. G6P5005 @ 15/3 weeks (EDC 2/14 [tentative], based on Patient's last menstrual period was 07/18/2018 (exact date).).  Preg complicated by has CIN II (cervical intraepithelial neoplasia II); AMA (advanced maternal age) multigravida 35+; Supervision of high risk pregnancy, antepartum; History of LEEP (loop electrosurgical excision procedure) of cervix complicating pregnancy; and Language barrier on their problem list.   Any events prior to today's visit: no Her periods were: qmonth, regular She was using no method when she conceived.  She has Negative signs or symptoms of nausea/vomiting of pregnancy. She has Negative signs or symptoms of miscarriage or preterm labor On any medications around the time she conceived/early pregnancy: No   ROS: A 12-point review of systems was performed and negative, except as stated in the above HPI.  OBGYN History: As per HPI. OB History  Gravida Para Term Preterm AB Living  6 5 5  0 0 5  SAB TAB Ectopic Multiple Live Births  0 0 0 0 5    # Outcome Date GA Lbr Len/2nd Weight Sex Delivery Anes PTL Lv  6 Current           5 Term 2012     Vag-Spont     4 Term      Vag-Spont     3 Term      Vag-Spont     2 Term      Vag-Spont     1 Term      Vag-Spont       Any issues with any prior pregnancies: no Prior children are healthy, doing well, and without any problems or issues: yes History of pap smears: Yes. Last pap smear 2015 negative and hpv neg   Past Medical History: Past Medical History:  Diagnosis Date  . Cervix dysplasia     Past Surgical History: Past Surgical History:  Procedure Laterality Date  . LEEP      Family History:  Family History  Problem Relation Age of Onset  . Diabetes Mother   . Hypertension Sister   . Cancer Sister    She denies any  history of mental retardation, birth defects or genetic disorders in her or the FOB's history  Social History:  Social History   Socioeconomic History  . Marital status: Married    Spouse name: Not on file  . Number of children: Not on file  . Years of education: Not on file  . Highest education level: Not on file  Occupational History  . Not on file  Social Needs  . Financial resource strain: Not on file  . Food insecurity    Worry: Not on file    Inability: Not on file  . Transportation needs    Medical: Not on file    Non-medical: Not on file  Tobacco Use  . Smoking status: Never Smoker  . Smokeless tobacco: Never Used  Substance and Sexual Activity  . Alcohol use: No  . Drug use: No  . Sexual activity: Yes  Lifestyle  . Physical activity    Days per week: Not on file    Minutes per session: Not on file  . Stress: Not on file  Relationships  . Social Musicianconnections    Talks on phone: Not on file    Gets together: Not on file  Attends religious service: Not on file    Active member of club or organization: Not on file    Attends meetings of clubs or organizations: Not on file    Relationship status: Not on file  . Intimate partner violence    Fear of current or ex partner: Not on file    Emotionally abused: Not on file    Physically abused: Not on file    Forced sexual activity: Not on file  Other Topics Concern  . Not on file  Social History Narrative  . Not on file     Allergy: No Known Allergies  Health Maintenance:  Mammogram Up to Date: no  Current Outpatient Medications: PNV  Physical Exam:   BP 108/60   Pulse 82   Wt 156 lb (70.8 kg)   LMP 07/18/2018 (Exact Date)   BMI 29.48 kg/m  Body mass index is 29.48 kg/m. Contractions: Not present Vag. Bleeding: None. Fundal height: not applicable FHTs: 798X  General appearance: Well nourished, well developed female in no acute distress.  Neck:  Supple, normal appearance, and no thyromegaly   Cardiovascular: S1, S2 normal, no murmur, rub or gallop, regular rate and rhythm Respiratory:  Clear to auscultation bilateral. Normal respiratory effort Abdomen: positive bowel sounds and no masses, hernias; diffusely non tender to palpation, non distended Breasts: breasts appear normal, no suspicious masses, no skin or nipple changes or axillary nodes, and negative palpation. Neuro/Psych:  Normal mood and affect.  Skin:  Warm and dry.  Lymphatic:  No inguinal lymphadenopathy.   Pelvic exam: is not limited by body habitus EGBUS: within normal limits, Vagina: within normal limits and with no blood in the vault, Cervix: closed, looks about 2cm in length, feels about 2cm in length/closed/hard. Uterus:  enlarged, c/w 16 week size, and Adnexa:  normal adnexa and no mass, fullness, tenderness  Laboratory: deferred  Imaging:  none  Assessment: pt stable, ?short cx vs normal s/p h/o leep  Plan: 1. Supervision of high risk pregnancy, antepartum Routine care Confirm dates after anatomy u/s.  Pt amenable to starting low dose asa Pt has cuff at home. - Culture, OB Urine - Obstetric Panel, Including HIV - Hemoglobin A1c - Cytology - PAP( [place]) - TSH - Comprehensive metabolic panel - Protein / creatinine ratio, urine  2. Language barrier Interpreter used  3. History of loop electrosurgical excision procedure (LEEP) of cervix affecting pregnancy, antepartum Will get transvag cx length with mfm sometime in next week. If all normal, can do other u/s at [facility] u/s LEEP was after most recent vag delivery  4. Multigravida of advanced maternal age in second trimester Declines genetics  5. CIN II (cervical intraepithelial neoplasia II) F/u pap from today.   Problem list reviewed and updated.  Follow up in 3 weeks.  The nature of [place] with multiple MDs and other Advanced Practice Providers was explained to patient; also emphasized  that residents, students are part of our team.  >50% of 25 min visit spent on counseling and coordination of care.     Durene Romans MD Attending Center for [facility])

## 2018-11-04 ENCOUNTER — Encounter: Payer: Self-pay | Admitting: *Deleted

## 2018-11-04 DIAGNOSIS — Z641 Problems related to multiparity: Secondary | ICD-10-CM | POA: Insufficient documentation

## 2018-11-04 LAB — OBSTETRIC PANEL, INCLUDING HIV
Antibody Screen: NEGATIVE
Basophils Absolute: 0.1 10*3/uL (ref 0.0–0.2)
Basos: 1 %
EOS (ABSOLUTE): 0.1 10*3/uL (ref 0.0–0.4)
Eos: 1 %
HIV Screen 4th Generation wRfx: NONREACTIVE
Hematocrit: 36.6 % (ref 34.0–46.6)
Hemoglobin: 12.5 g/dL (ref 11.1–15.9)
Hepatitis B Surface Ag: NEGATIVE
Immature Grans (Abs): 0.1 10*3/uL (ref 0.0–0.1)
Immature Granulocytes: 1 %
Lymphocytes Absolute: 2.9 10*3/uL (ref 0.7–3.1)
Lymphs: 26 %
MCH: 29.4 pg (ref 26.6–33.0)
MCHC: 34.2 g/dL (ref 31.5–35.7)
MCV: 86 fL (ref 79–97)
Monocytes Absolute: 0.6 10*3/uL (ref 0.1–0.9)
Monocytes: 6 %
Neutrophils Absolute: 7.3 10*3/uL — ABNORMAL HIGH (ref 1.4–7.0)
Neutrophils: 65 %
Platelets: 368 10*3/uL (ref 150–450)
RBC: 4.25 x10E6/uL (ref 3.77–5.28)
RDW: 12.5 % (ref 11.7–15.4)
RPR Ser Ql: NONREACTIVE
Rh Factor: POSITIVE
Rubella Antibodies, IGG: 24.8 index (ref >0.99)
WBC: 11 10*3/uL — ABNORMAL HIGH (ref 3.4–10.8)

## 2018-11-04 LAB — COMPREHENSIVE METABOLIC PANEL
ALT: 10 IU/L (ref 0–32)
AST: 10 IU/L (ref 0–40)
Albumin/Globulin Ratio: 1.5 (ref 1.2–2.2)
Albumin: 3.8 g/dL (ref 3.8–4.8)
Alkaline Phosphatase: 44 IU/L (ref 39–117)
BUN/Creatinine Ratio: 21 (ref 9–23)
BUN: 8 mg/dL (ref 6–24)
Bilirubin Total: <0.2 mg/dL (ref 0.0–1.2)
CO2: 18 mmol/L — ABNORMAL LOW (ref 20–29)
Calcium: 9.1 mg/dL (ref 8.7–10.2)
Chloride: 103 mmol/L (ref 96–106)
Creatinine, Ser: 0.39 mg/dL — ABNORMAL LOW (ref 0.57–1.00)
GFR calc Af Amer: 150 mL/min/{1.73_m2} (ref >59)
GFR calc non Af Amer: 130 mL/min/{1.73_m2} (ref >59)
Globulin, Total: 2.6 g/dL (ref 1.5–4.5)
Glucose: 79 mg/dL (ref 65–99)
Potassium: 3.9 mmol/L (ref 3.5–5.2)
Sodium: 135 mmol/L (ref 134–144)
Total Protein: 6.4 g/dL (ref 6.0–8.5)

## 2018-11-04 LAB — PROTEIN / CREATININE RATIO, URINE
Creatinine, Urine: 217.9 mg/dL
Protein, Ur: 33.3 mg/dL
Protein/Creat Ratio: 153 mg/g creat (ref 0–200)

## 2018-11-04 LAB — TSH: TSH: 1.33 u[IU]/mL (ref 0.450–4.500)

## 2018-11-04 LAB — HEMOGLOBIN A1C
Est. average glucose Bld gHb Est-mCnc: 100 mg/dL
Hgb A1c MFr Bld: 5.1 % (ref 4.8–5.6)

## 2018-11-05 LAB — CYTOLOGY - PAP
Chlamydia: NEGATIVE
Diagnosis: NEGATIVE
HPV: NOT DETECTED
Neisseria Gonorrhea: NEGATIVE

## 2018-11-05 LAB — URINE CULTURE, OB REFLEX

## 2018-11-05 LAB — CULTURE, OB URINE

## 2018-11-09 ENCOUNTER — Other Ambulatory Visit (HOSPITAL_COMMUNITY): Payer: Self-pay | Admitting: Obstetrics and Gynecology

## 2018-11-09 ENCOUNTER — Other Ambulatory Visit: Payer: Self-pay

## 2018-11-09 ENCOUNTER — Ambulatory Visit (HOSPITAL_COMMUNITY): Payer: Self-pay | Admitting: *Deleted

## 2018-11-09 ENCOUNTER — Encounter (HOSPITAL_COMMUNITY): Payer: Self-pay | Admitting: *Deleted

## 2018-11-09 ENCOUNTER — Ambulatory Visit (HOSPITAL_COMMUNITY)
Admission: RE | Admit: 2018-11-09 | Discharge: 2018-11-09 | Disposition: A | Discharge status: Home / Self Care | Payer: Medicaid Other | Source: Ambulatory Visit | Attending: Obstetrics and Gynecology | Admitting: Obstetrics and Gynecology

## 2018-11-09 VITALS — BP 104/51 | HR 70 | Temp 98.5°F

## 2018-11-09 DIAGNOSIS — O09529 Supervision of elderly multigravida, unspecified trimester: Secondary | ICD-10-CM

## 2018-11-09 DIAGNOSIS — Z3686 Encounter for antenatal screening for cervical length: Secondary | ICD-10-CM | POA: Diagnosis not present

## 2018-11-09 DIAGNOSIS — N883 Incompetence of cervix uteri: Secondary | ICD-10-CM

## 2018-11-09 DIAGNOSIS — O09522 Supervision of elderly multigravida, second trimester: Secondary | ICD-10-CM | POA: Diagnosis not present

## 2018-11-09 DIAGNOSIS — O3442 Maternal care for other abnormalities of cervix, second trimester: Secondary | ICD-10-CM | POA: Diagnosis not present

## 2018-11-09 DIAGNOSIS — Z3A16 16 weeks gestation of pregnancy: Secondary | ICD-10-CM | POA: Diagnosis not present

## 2018-11-10 ENCOUNTER — Other Ambulatory Visit (HOSPITAL_COMMUNITY): Payer: Self-pay | Admitting: *Deleted

## 2018-11-10 DIAGNOSIS — O09522 Supervision of elderly multigravida, second trimester: Secondary | ICD-10-CM

## 2018-11-17 ENCOUNTER — Ambulatory Visit (HOSPITAL_COMMUNITY): Payer: Self-pay

## 2018-11-30 ENCOUNTER — Ambulatory Visit (HOSPITAL_COMMUNITY): Payer: Self-pay

## 2018-11-30 ENCOUNTER — Encounter (HOSPITAL_COMMUNITY): Payer: Self-pay

## 2018-12-01 ENCOUNTER — Other Ambulatory Visit: Payer: Self-pay

## 2018-12-01 ENCOUNTER — Encounter: Payer: Self-pay | Admitting: Obstetrics & Gynecology

## 2018-12-01 ENCOUNTER — Telehealth (INDEPENDENT_AMBULATORY_CARE_PROVIDER_SITE_OTHER): Payer: Self-pay | Admitting: Obstetrics & Gynecology

## 2018-12-01 VITALS — BP 104/66 | HR 82

## 2018-12-01 DIAGNOSIS — Z3A19 19 weeks gestation of pregnancy: Secondary | ICD-10-CM

## 2018-12-01 DIAGNOSIS — Z641 Problems related to multiparity: Secondary | ICD-10-CM

## 2018-12-01 DIAGNOSIS — O0992 Supervision of high risk pregnancy, unspecified, second trimester: Secondary | ICD-10-CM

## 2018-12-01 DIAGNOSIS — O0990 Supervision of high risk pregnancy, unspecified, unspecified trimester: Secondary | ICD-10-CM

## 2018-12-01 DIAGNOSIS — O09522 Supervision of elderly multigravida, second trimester: Secondary | ICD-10-CM

## 2018-12-01 NOTE — Progress Notes (Signed)
   TELEHEALTH VIRTUAL OBSTETRICS VISIT ENCOUNTER NOTE  I connected with Leah Fernandez on 12/01/18 at  3:55 PM EDT by telephone at home and verified that I am speaking with the correct person using two identifiers.   I discussed the limitations, risks, security and privacy concerns of performing an evaluation and management service by telephone and the availability of in person appointments. I also discussed with the patient that there may be a patient responsible charge related to this service. The patient expressed understanding and agreed to proceed.  Subjective:  Leah Fernandez is a 43 y.o. G6P5005 at [redacted]w[redacted]d being followed for ongoing prenatal care.  She is currently monitored for the following issues for this high-risk pregnancy and has CIN II (cervical intraepithelial neoplasia II); AMA (advanced maternal age) multigravida 35+; Supervision of high risk pregnancy, antepartum; History of LEEP (loop electrosurgical excision procedure) of cervix complicating pregnancy; Language barrier; and [place] multiparity on their problem list.  Patient reports no complaints. Reports fetal movement. Denies any contractions, bleeding or leaking of fluid.   The following portions of the patient's history were reviewed and updated as appropriate: allergies, current medications, past family history, past medical history, past social history, past surgical history and problem list.   Objective:   General:  Alert, oriented and cooperative.   Mental Status: Normal mood and affect perceived. Normal judgment and thought content.  Rest of physical exam deferred due to type of encounter  Assessment and Plan:  Pregnancy: G6P5005 at [redacted]w[redacted]d 1. Supervision of high risk pregnancy, antepartum - [place] MFM OB COMP + 14 WK; Future  Will look at cervical length sue to LEEP - refused genetics - Pt to take BP at home - Continue aspirin - EDC:  Dr. Gertie Exon scanned patient on 9/1 but no measurements.  He confirmed that best  EDC is by LMP.  Need [facility] [place] on chart for completion.    Preterm labor symptoms and general obstetric precautions including but not limited to vaginal bleeding, contractions, leaking of fluid and fetal movement were reviewed in detail with the patient.  I discussed the assessment and treatment plan with the patient. The patient was provided an opportunity to ask questions and all were answered. The patient agreed with the plan and demonstrated an understanding of the instructions. The patient was advised to call back or seek an in-person office evaluation/go to MAU at [facility] for any urgent or concerning symptoms. Please refer to After Visit Summary for other counseling recommendations.   I provided 20 minutes of non-face-to-face time during this encounter.  Interpreter Eda Royal present and translating.    Pt request phone visit in 4 weeks.    Silas Sacramento, MD Center for [facility], [place]

## 2018-12-02 ENCOUNTER — Encounter: Payer: Self-pay | Admitting: Obstetrics & Gynecology

## 2018-12-10 ENCOUNTER — Ambulatory Visit (HOSPITAL_COMMUNITY): Payer: Self-pay | Attending: Obstetrics and Gynecology

## 2018-12-13 ENCOUNTER — Encounter: Payer: Self-pay | Admitting: *Deleted

## 2018-12-14 ENCOUNTER — Encounter: Payer: Self-pay | Admitting: *Deleted

## 2018-12-14 ENCOUNTER — Telehealth: Payer: Self-pay | Admitting: *Deleted

## 2018-12-14 NOTE — Telephone Encounter (Signed)
Called A[place] with Interpreter Eda Royal and left  Message we have scheduled follow up  Ultrasound at [facility] for 12/23/18 at 11:00 ( to finish anatomy of heart). Call us if questions. Jacques Navy

## 2018-12-15 ENCOUNTER — Telehealth: Payer: Self-pay | Admitting: Family Medicine

## 2018-12-15 NOTE — Telephone Encounter (Signed)
Received a call from Leah Fernandez interpreter. Patient wanted to know if she could fly to [place]. She will be gone for 4 weeks. Per Dr Kennon Rounds, if she does fly she should walk every hour.

## 2018-12-30 ENCOUNTER — Other Ambulatory Visit: Payer: Self-pay

## 2018-12-30 ENCOUNTER — Telehealth (INDEPENDENT_AMBULATORY_CARE_PROVIDER_SITE_OTHER): Payer: Self-pay | Admitting: Obstetrics & Gynecology

## 2018-12-30 DIAGNOSIS — O0990 Supervision of high risk pregnancy, unspecified, unspecified trimester: Secondary | ICD-10-CM

## 2018-12-30 DIAGNOSIS — O3440 Maternal care for other abnormalities of cervix, unspecified trimester: Secondary | ICD-10-CM

## 2018-12-30 DIAGNOSIS — O3442 Maternal care for other abnormalities of cervix, second trimester: Secondary | ICD-10-CM

## 2018-12-30 DIAGNOSIS — Z3A23 23 weeks gestation of pregnancy: Secondary | ICD-10-CM

## 2018-12-30 DIAGNOSIS — Z9889 Other specified postprocedural states: Secondary | ICD-10-CM

## 2018-12-30 DIAGNOSIS — Z641 Problems related to multiparity: Secondary | ICD-10-CM

## 2018-12-30 DIAGNOSIS — O0992 Supervision of high risk pregnancy, unspecified, second trimester: Secondary | ICD-10-CM

## 2018-12-30 DIAGNOSIS — Z789 Other specified health status: Secondary | ICD-10-CM

## 2018-12-30 DIAGNOSIS — O09522 Supervision of elderly multigravida, second trimester: Secondary | ICD-10-CM

## 2018-12-30 DIAGNOSIS — O09529 Supervision of elderly multigravida, unspecified trimester: Secondary | ICD-10-CM

## 2018-12-30 NOTE — Progress Notes (Signed)
[facility] # [phone] I connected with  Leah Fernandez on 12/30/18 at  3:15 PM EDT by telephone and verified that I am speaking with the correct person using two identifiers.   I discussed the limitations, risks, security and privacy concerns of performing an evaluation and management service by telephone and the availability of in person appointments. I also discussed with the patient that there may be a patient responsible charge related to this service. The patient expressed understanding and agreed to proceed.  Verdell Carmine, RN 12/30/2018  3:21 PM

## 2018-12-30 NOTE — Progress Notes (Signed)
   TELEHEALTH VIRTUAL OBSTETRICS VISIT ENCOUNTER NOTE  I connected with Leah Fernandez on 12/30/18 at  3:15 PM EDT by telephone at home and verified that I am speaking with the correct person using two identifiers.   I discussed the limitations, risks, security and privacy concerns of performing an evaluation and management service by telephone and the availability of in person appointments. I also discussed with the patient that there may be a patient responsible charge related to this service. The patient expressed understanding and agreed to proceed.  Subjective:  Leah Fernandez is a 43 y.o. G6P5005 at [redacted]w[redacted]d being followed for ongoing prenatal care.  She is currently monitored for the following issues for this high-risk pregnancy and has CIN II (cervical intraepithelial neoplasia II); AMA (advanced maternal age) multigravida 35+; Supervision of high risk pregnancy, antepartum; History of LEEP (loop electrosurgical excision procedure) of cervix complicating pregnancy; Language barrier; and [place] multiparity on their problem list.  Patient reports no complaints. Reports fetal movement. Denies any contractions, bleeding or leaking of fluid.   The following portions of the patient's history were reviewed and updated as appropriate: allergies, current medications, past family history, past medical history, past social history, past surgical history and problem list.   Objective:   General:  Alert, oriented and cooperative.   Mental Status: Normal mood and affect perceived. Normal judgment and thought content.  Rest of physical exam deferred due to type of encounter  Assessment and Plan:  Pregnancy: G6P5005 at [redacted]w[redacted]d 1. Supervision of high risk pregnancy, antepartum + FM  2. Language barrier Spanish  3. History of loop electrosurgical excision procedure (LEEP) of cervix affecting pregnancy, antepartum  4. [place] multiparity Plans for PP IUD  5. Antepartum multigravida of advanced  maternal age Taking a baby ASA daily  Preterm labor symptoms and general obstetric precautions including but not limited to vaginal bleeding, contractions, leaking of fluid and fetal movement were reviewed in detail with the patient.  I discussed the assessment and treatment plan with the patient. The patient was provided an opportunity to ask questions and all were answered. The patient agreed with the plan and demonstrated an understanding of the instructions. The patient was advised to call back or seek an in-person office evaluation/go to MAU at [facility] for any urgent or concerning symptoms. Please refer to After Visit Summary for other counseling recommendations.   I provided 11 minutes of non-face-to-face time during this encounter.  Return in about 4 weeks (around 01/27/2019) for in person.  No future appointments.  Lavonia Drafts, MD Center for [facility], [place]

## 2019-01-26 ENCOUNTER — Other Ambulatory Visit: Payer: Self-pay | Admitting: *Deleted

## 2019-01-26 ENCOUNTER — Telehealth: Payer: Self-pay | Admitting: Obstetrics and Gynecology

## 2019-01-26 DIAGNOSIS — O0990 Supervision of high risk pregnancy, unspecified, unspecified trimester: Secondary | ICD-10-CM

## 2019-01-26 NOTE — Telephone Encounter (Signed)
Spanish interpreter Eda attempted to call patient about her appointment on 11/19 @ 8:50. No answer, Eda left a voicemail instructing patient to wear a face mask for the entire appointment and no visitors are allowed. Patient instructed not to attend the appointment if she has any symptoms. Symptom list and office number left. Patient instructed to come fasting

## 2019-01-28 ENCOUNTER — Encounter: Payer: Self-pay | Admitting: Obstetrics and Gynecology

## 2019-01-28 ENCOUNTER — Other Ambulatory Visit: Payer: Self-pay

## 2019-02-08 ENCOUNTER — Telehealth: Payer: Self-pay | Admitting: Lactation Services

## 2019-02-08 NOTE — Telephone Encounter (Signed)
Called Cathy at [facility] [place] to request [place] results from 10/15 be sent to the office as not noted in the chart.

## 2019-02-10 ENCOUNTER — Encounter: Payer: Self-pay | Admitting: *Deleted

## 2019-02-11 ENCOUNTER — Ambulatory Visit (INDEPENDENT_AMBULATORY_CARE_PROVIDER_SITE_OTHER): Payer: Self-pay | Admitting: Obstetrics and Gynecology

## 2019-02-11 ENCOUNTER — Encounter: Payer: Self-pay | Admitting: Obstetrics and Gynecology

## 2019-02-11 ENCOUNTER — Other Ambulatory Visit: Payer: Self-pay

## 2019-02-11 VITALS — BP 95/36 | HR 78 | Wt 173.0 lb

## 2019-02-11 DIAGNOSIS — O0990 Supervision of high risk pregnancy, unspecified, unspecified trimester: Secondary | ICD-10-CM

## 2019-02-11 DIAGNOSIS — O0993 Supervision of high risk pregnancy, unspecified, third trimester: Secondary | ICD-10-CM

## 2019-02-11 DIAGNOSIS — O3443 Maternal care for other abnormalities of cervix, third trimester: Secondary | ICD-10-CM

## 2019-02-11 DIAGNOSIS — O09523 Supervision of elderly multigravida, third trimester: Secondary | ICD-10-CM

## 2019-02-11 DIAGNOSIS — Z3A29 29 weeks gestation of pregnancy: Secondary | ICD-10-CM

## 2019-02-11 DIAGNOSIS — Z789 Other specified health status: Secondary | ICD-10-CM

## 2019-02-11 DIAGNOSIS — Z9889 Other specified postprocedural states: Secondary | ICD-10-CM

## 2019-02-11 NOTE — Patient Instructions (Signed)
Tercer trimestre de embarazo Third Trimester of Pregnancy  El tercer trimestre comprende desde la semana28 hasta la semana40 (desde el mes7 hasta el mes9). En este trimestre, el beb en gestacin (feto) crece muy rpidamente. Hacia el final del noveno mes, el beb en gestacin mide alrededor de 20pulgadas (45cm) de largo. Pesa entre 6y 10libras (2,70y 4,50kg). Siga estas indicaciones en su casa: Medicamentos  Tome los medicamentos de venta libre y los recetados solamente como se lo haya indicado el mdico. Algunos medicamentos son seguros para tomar durante el embarazo y otros no lo son.  Tome vitaminas prenatales que contengan por lo menos 600microgramos (?g) de cido flico.  Si tiene dificultad para mover el intestino (estreimiento), tome un medicamento para ablandar las heces (laxante) si su mdico se lo autoriza. Comida y bebida   Ingiera alimentos saludables de manera regular.  No coma carne cruda ni quesos sin cocinar.  Si obtiene poca cantidad de calcio de los alimentos que ingiere, consulte a su mdico sobre la posibilidad de tomar un suplemento diario de calcio.  La ingesta diaria de cuatro o cinco comidas pequeas en lugar de tres comidas abundantes.  Evite el consumo de alimentos ricos en grasas y azcares, como los alimentos fritos y los dulces.  Para evitar el estreimiento: ? Consuma alimentos ricos en fibra, como frutas y verduras frescas, cereales integrales y frijoles. ? Beba suficiente lquido para mantener el pis (orina) claro o de color amarillo plido. Actividad  Haga ejercicios solamente como se lo haya indicado el mdico. Interrumpa la actividad fsica si comienza a tener calambres.  No levante objetos pesados, use zapatos de tacones bajos y sintese derecha.  No haga ejercicio si hace demasiado calor, hay demasiada humedad o se encuentra en un lugar de mucha altura (altitud alta).  Puede continuar teniendo relaciones sexuales, a menos que el  mdico le indique lo contrario. Alivio del dolor y del malestar  Use un sostn que le brinde buen soporte si sus mamas estn sensibles.  Haga pausas frecuentes y descanse con las piernas levantadas si tiene calambres en las piernas o dolor en la zona lumbar.  Dese baos de asiento con agua tibia para aliviar el dolor o las molestias causadas por las hemorroides. Use una crema para las hemorroides si el mdico la autoriza.  Si desarrolla venas hinchadas y abultadas (vrices) en las piernas: ? Use medias de compresin o medias de descanso como se lo haya indicado el mdico. ? Levante (eleve) los pies durante 15minutos, 3 o 4veces por da. ? Limite el consumo de sal en sus alimentos. Seguridad  Colquese el cinturn de seguridad cuando conduzca.  Haga una lista de los nmeros de telfono de emergencia, que incluya los nmeros de telfono de familiares, amigos, el hospital, as como los departamentos de polica y bomberos. Preparacin para la llegada del beb Para prepararse para la llegada de su beb:  Tome clases prenatales.  Practique ir manejando al hospital.  Visite el hospital y recorra el rea de maternidad.  Hable en su trabajo acerca de tomar licencia cuando llegue el beb.  Prepare el bolso que llevar al hospital.  Prepare la habitacin del beb.  Concurra a los controles mdicos.  Compre un asiento de seguridad orientado hacia atrs para llevar al beb en el automvil. Aprenda cmo instalarlo en el auto. Instrucciones generales  No se d baos de inmersin en agua caliente, baos turcos ni saunas.  No consuma ningn producto que contenga nicotina o tabaco, como cigarrillos y cigarrillos   electrnicos. Si necesita ayuda para dejar de fumar, consulte al mdico.  No beba alcohol.  No se haga duchas vaginales ni use tampones o toallas higinicas perfumadas.  No mantenga las piernas cruzadas durante mucho tiempo.  No haga viajes de larga distancia, excepto si es  obligatorio. Hgalos solamente si su mdico la autoriza.  Visite a su dentista si no lo ha hecho durante el embarazo. Use un cepillo de cerdas suaves para cepillarse los dientes. Psese el hilo dental con suavidad.  Evite el contacto con las bandejas sanitarias de los gatos y la tierra que estos animales usan. Estos elementos contienen bacterias que pueden causar defectos congnitos al beb y la posible prdida del beb (aborto espontneo) o la muerte fetal.  Concurra a todas las visitas prenatales como se lo haya indicado el mdico. Esto es importante. Comunquese con un mdico si:  No est segura de si est en trabajo de parto o si ha roto la bolsa de las aguas.  Tiene mareos.  Tiene clicos leves o siente presin en la parte baja del vientre.  Sufre un dolor persistente en el abdomen.  Sigue teniendo malestar estomacal, vomita o tiene heces lquidas.  Advierte un lquido con olor ftido que proviene de la vagina.  Siente dolor al orinar. Solicite ayuda de inmediato si:  Tiene fiebre.  Tiene una prdida de lquido por la vagina.  Tiene sangrado o pequeas prdidas vaginales.  Siente dolor intenso o clicos en el abdomen.  Aumenta o baja de peso rpidamente.  Tiene dificultades para recuperar el aliento y siente dolor en el pecho.  Sbitamente se le hinchan mucho el rostro, las manos, los tobillos, los pies o las piernas.  No ha sentido los movimientos del beb durante una hora.  Siente un dolor de cabeza intenso que no se alivia con medicamentos.  Tiene dificultad para ver.  Tiene prdida de lquido o le sale un chorro de lquido de la vagina antes de estar en la semana 37.  Tiene espasmos abdominales (contracciones) regulares antes de estar en la semana 37. Resumen  El tercer trimestre comprende desde la semana28 hasta la semana40 (desde el mes7 hasta el mes9). Esta es la poca en que el beb en gestacin crece muy rpidamente.  Siga los consejos del mdico  con respecto a los medicamentos, la alimentacin y la actividad.  Preprese para la llegada del beb tomando las clases prenatales, preparando todo lo que necesitar el beb, arreglando la habitacin del beb y concurriendo a los controles mdicos.  Solicite ayuda de inmediato si tiene sangrado por la vagina, siente dolor en el pecho o tiene dificultad para respirar, o si no ha sentido que su beb se mueve en el transcurso de ms de una hora. Esta informacin no tiene como fin reemplazar el consejo del mdico. Asegrese de hacerle al mdico cualquier pregunta que tenga. Document Released: 10/27/2012 Document Revised: 09/29/2016 Document Reviewed: 09/29/2016 Elsevier Patient Education  2020 [employer]vier [employer]  

## 2019-02-11 NOTE — Progress Notes (Signed)
Will go to the [facility] department for flu and tdap

## 2019-02-11 NOTE — Progress Notes (Signed)
Subjective:  Leah Fernandez is a 43 y.o. G6P5005 at [redacted]w[redacted]d being seen today for ongoing prenatal care.  She is currently monitored for the following issues for this high-risk pregnancy and has CIN II (cervical intraepithelial neoplasia II); AMA (advanced maternal age) multigravida 35+; Supervision of high risk pregnancy, antepartum; History of LEEP (loop electrosurgical excision procedure) of cervix complicating pregnancy; Language barrier; and [place] multiparity on their problem list.  Patient reports general discomforts of pregnancy.  Contractions: Not present. Vag. Bleeding: None.  Movement: Present. Denies leaking of fluid.   The following portions of the patient's history were reviewed and updated as appropriate: allergies, current medications, past family history, past medical history, past social history, past surgical history and problem list. Problem list updated.  Objective:   Vitals:   02/11/19 0906  BP: (!) 95/36  Pulse: 78  Weight: 173 lb (78.5 kg)    Fetal Status: Fetal Heart Rate (bpm): 150   Movement: Present     General:  Alert, oriented and cooperative. Patient is in no acute distress.  Skin: Skin is warm and dry. No rash noted.   Cardiovascular: Normal heart rate noted  Respiratory: Normal respiratory effort, no problems with respiration noted  Abdomen: Soft, gravid, appropriate for gestational age. Pain/Pressure: Present     Pelvic:  Cervical exam deferred        Extremities: Normal range of motion.  Edema: None  Mental Status: Normal mood and affect. Normal behavior. Normal judgment and thought content.   Urinalysis:      Assessment and Plan:  Pregnancy: G6P5005 at [redacted]w[redacted]d  1. Supervision of high risk pregnancy, antepartum Stable 28 week labs today  2. Multigravida of advanced maternal age in third trimester Stable Antenatal testing as indicated  3. History of loop electrosurgical excision procedure (LEEP) of cervix affecting pregnancy in third  trimester Stable No S/Sx of PTL  4. Language barrier Live interrupter used during today's visit  Preterm labor symptoms and general obstetric precautions including but not limited to vaginal bleeding, contractions, leaking of fluid and fetal movement were reviewed in detail with the patient. Please refer to After Visit Summary for other counseling recommendations.  Return in about 2 weeks (around 02/25/2019) for OB visit, virtual, MD provider.   Chancy Milroy, MD

## 2019-02-12 LAB — GLUCOSE TOLERANCE, 2 HOURS W/ 1HR
Glucose, 1 hour: 170 mg/dL (ref 65–179)
Glucose, 2 hour: 108 mg/dL (ref 65–152)
Glucose, Fasting: 91 mg/dL (ref 65–91)

## 2019-02-12 LAB — CBC
Hematocrit: 33.7 % — ABNORMAL LOW (ref 34.0–46.6)
Hemoglobin: 11.6 g/dL (ref 11.1–15.9)
MCH: 29 pg (ref 26.6–33.0)
MCHC: 34.4 g/dL (ref 31.5–35.7)
MCV: 84 fL (ref 79–97)
Platelets: 406 10*3/uL (ref 150–450)
RBC: 4 x10E6/uL (ref 3.77–5.28)
RDW: 12.8 % (ref 11.7–15.4)
WBC: 10.8 10*3/uL (ref 3.4–10.8)

## 2019-02-12 LAB — RPR: RPR Ser Ql: NONREACTIVE

## 2019-02-12 LAB — HIV ANTIBODY (ROUTINE TESTING W REFLEX): HIV Screen 4th Generation wRfx: NONREACTIVE

## 2019-02-28 ENCOUNTER — Telehealth (INDEPENDENT_AMBULATORY_CARE_PROVIDER_SITE_OTHER): Payer: Self-pay | Admitting: Obstetrics and Gynecology

## 2019-02-28 VITALS — BP 106/68 | HR 84

## 2019-02-28 DIAGNOSIS — Z789 Other specified health status: Secondary | ICD-10-CM

## 2019-02-28 DIAGNOSIS — O0993 Supervision of high risk pregnancy, unspecified, third trimester: Secondary | ICD-10-CM

## 2019-02-28 DIAGNOSIS — O3443 Maternal care for other abnormalities of cervix, third trimester: Secondary | ICD-10-CM

## 2019-02-28 DIAGNOSIS — O09523 Supervision of elderly multigravida, third trimester: Secondary | ICD-10-CM

## 2019-02-28 DIAGNOSIS — Z9889 Other specified postprocedural states: Secondary | ICD-10-CM

## 2019-02-28 DIAGNOSIS — O0990 Supervision of high risk pregnancy, unspecified, unspecified trimester: Secondary | ICD-10-CM

## 2019-02-28 DIAGNOSIS — Z3A35 35 weeks gestation of pregnancy: Secondary | ICD-10-CM

## 2019-02-28 NOTE — Progress Notes (Signed)
   TELEHEALTH VIRTUAL OBSTETRICS VISIT ENCOUNTER NOTE  Clinic: [facility]  I connected with Leah Fernandez on 02/28/19 at  8:15 AM EST by telephone at home and verified that I am speaking with the correct person using two identifiers.   I discussed the limitations, risks, security and privacy concerns of performing an evaluation and management service by telephone and the availability of in person appointments. I also discussed with the patient that there may be a patient responsible charge related to this service. The patient expressed understanding and agreed to proceed.  Subjective:  Leah Fernandez is a 44 y.o. G6P5005 at [redacted]w[redacted]d being followed for ongoing prenatal care.  She is currently monitored for the following issues for this high-risk pregnancy and has CIN II (cervical intraepithelial neoplasia II); AMA (advanced maternal age) multigravida 35+; Supervision of high risk pregnancy, antepartum; History of LEEP (loop electrosurgical excision procedure) of cervix complicating pregnancy; Language barrier; and [place] multiparity on their problem list.  Patient reports no complaints. Reports fetal movement. Denies any contractions, bleeding or leaking of fluid.   The following portions of the patient's history were reviewed and updated as appropriate: allergies, current medications, past family history, past medical history, past social history, past surgical history and problem list.   Objective:   Vitals:   02/28/19 0823  BP: 106/68  Pulse: 84    Babyscripts Data Reviewed: not applicable  General:  Alert, oriented and cooperative.   Mental Status: Normal mood and affect perceived. Normal judgment and thought content.  Rest of physical exam deferred due to type of encounter  Assessment and Plan:  Pregnancy: G6P5005 at [redacted]w[redacted]d 1. History of loop electrosurgical excision procedure (LEEP) of cervix affecting pregnancy in third trimester No issues  2.  Multigravida of advanced maternal age in third trimester Start qwk ap testing at 36wks, delivery 39-40wks  3. Language barrier Interpreter used  4. Supervision of high risk pregnancy, antepartum Routine care  Preterm labor symptoms and general obstetric precautions including but not limited to vaginal bleeding, contractions, leaking of fluid and fetal movement were reviewed in detail with the patient.  I discussed the assessment and treatment plan with the patient. The patient was provided an opportunity to ask questions and all were answered. The patient agreed with the plan and demonstrated an understanding of the instructions. The patient was advised to call back or seek an in-person office evaluation/go to [facility] at [facility] for any urgent or concerning symptoms. Please refer to After Visit Summary for other counseling recommendations.   I provided 7 minutes of non-face-to-face time during this encounter. The visit was conducted via Phone-medicine (MyChart not working)  Return in about 2 weeks (around 03/14/2019) for 2wk, high risk, in person or virtual, nst/bpp with diane.  No future appointments.  Aletha Halim, MD [facility] [place]

## 2019-02-28 NOTE — Progress Notes (Signed)
I connected with  [name] on 02/28/19 at  8:15 AM EST by telephone with [facility] interpreter Melina ID [phone] and verified that I am speaking with the correct person using two identifiers.   I discussed the limitations, risks, security and privacy concerns of performing an evaluation and management service by telephone and the availability of in person appointments. I also discussed with the patient that there may be a patient responsible charge related to this service. The patient expressed understanding and agreed to proceed.  Pt given instructions for MyChart. Text invite sent. Pt states her camera is not working. Proceeded with telephone visit with interpreter Raquel.  Annabell Howells, RN 02/28/2019  8:15 AM

## 2019-03-11 NOTE — L&D Delivery Note (Addendum)
OB/GYN Faculty Practice Delivery Note  Leah Fernandez is a 44 y.o. G6P5005 at [redacted]w[redacted]d. She was admitted for IOL d/t NRFHT, symptomatic COVID-19 (+).   ROM: 1h 12m with light mec fluid GBS Status: negative Maximum Maternal Temperature: 98.8*F  Labor Progress: Patient admitted to antepartum for symptomatic COVID-19 (+) with prolonged decels, subsequently induced for NRFHT. Induced using FB, pitocin, AROM, IUPC, and amnioinfusion, progressed to complete.  Delivery Date/Time: 04/06/2019 at 1050 Delivery: Called to room and patient was complete and feeling pressure in her bottom. Pushing was started. Head delivered Right Occiput Anterior. Nuchal cord present x1, easily reduced at perineum. Shoulder and body delivered in usual fashion. Infant with spontaneous cry, placed on mother's abdomen, dried and stimulated. Cord clamped x 2 after 1-minute delay, and cut by Dr. Salomon Mast. Cord blood drawn. Placenta delivered spontaneously with gentle cord traction. Fundus firm with massage and Pitocin. Labia, perineum, vagina, and cervix inspected with a hemostatic perineal abrasion. Postpartum IUD placed by Dr. Salomon Mast, see her note for details.   Placenta: 3 vessel cord, intact, to L&D Complications: symptomatic COVID-19 (+) requiring O2, AMA, grand multiparity Lacerations: none EBL: 110 mL Analgesia: epidural  Postpartum Planning [x]  message to sent to schedule follow-up  [x]  vaccines UTD  Infant: female  APGARs 8,9  weight pending  , MD [facility], PGY-1 04/06/2019, 11:05 AM  GME ATTESTATION:  I saw and evaluated the patient. I was gowned and gloved for the entire delivery. I personally placed the post placental Liletta IUD- see procedure note for details. I agree with the findings and the plan of care as documented in the resident's note.  [facility], DO OB Fellow, Faculty [address] [facility], Center for [facility] 04/06/2019 11:21 AM

## 2019-03-14 ENCOUNTER — Other Ambulatory Visit: Payer: Self-pay

## 2019-03-14 ENCOUNTER — Ambulatory Visit (INDEPENDENT_AMBULATORY_CARE_PROVIDER_SITE_OTHER): Payer: Medicaid Other | Admitting: Family Medicine

## 2019-03-14 VITALS — BP 107/59 | HR 90 | Wt 174.3 lb

## 2019-03-14 DIAGNOSIS — O0993 Supervision of high risk pregnancy, unspecified, third trimester: Secondary | ICD-10-CM | POA: Diagnosis not present

## 2019-03-14 DIAGNOSIS — Z23 Encounter for immunization: Secondary | ICD-10-CM | POA: Diagnosis not present

## 2019-03-14 DIAGNOSIS — Z789 Other specified health status: Secondary | ICD-10-CM

## 2019-03-14 DIAGNOSIS — Z3A34 34 weeks gestation of pregnancy: Secondary | ICD-10-CM

## 2019-03-14 DIAGNOSIS — Z9889 Other specified postprocedural states: Secondary | ICD-10-CM | POA: Diagnosis not present

## 2019-03-14 DIAGNOSIS — O3443 Maternal care for other abnormalities of cervix, third trimester: Secondary | ICD-10-CM

## 2019-03-14 DIAGNOSIS — O09523 Supervision of elderly multigravida, third trimester: Secondary | ICD-10-CM | POA: Diagnosis not present

## 2019-03-14 DIAGNOSIS — Z641 Problems related to multiparity: Secondary | ICD-10-CM

## 2019-03-14 DIAGNOSIS — O0990 Supervision of high risk pregnancy, unspecified, unspecified trimester: Secondary | ICD-10-CM

## 2019-03-14 NOTE — Progress Notes (Signed)
   PRENATAL VISIT NOTE  Subjective:  Leah Fernandez is a 44 y.o. G6P5005 at [redacted]w[redacted]d being seen today for ongoing prenatal care.  She is currently monitored for the following issues for this high-risk pregnancy and has CIN II (cervical intraepithelial neoplasia II); AMA (advanced maternal age) multigravida 35+; Supervision of high risk pregnancy, antepartum; History of LEEP (loop electrosurgical excision procedure) of cervix complicating pregnancy; Language barrier; and [place] multiparity on their problem list.  Patient reports occasional contractions.  Contractions: Irritability. Vag. Bleeding: None.  Movement: Present. Denies leaking of fluid.   The following portions of the patient's history were reviewed and updated as appropriate: allergies, current medications, past family history, past medical history, past social history, past surgical history and problem list.   Objective:   Vitals:   03/14/19 1042  BP: (!) 107/59  Pulse: 90  Weight: 174 lb 4.8 oz (79.1 kg)    Fetal Status: Fetal Heart Rate (bpm): 152   Movement: Present     General:  Alert, oriented and cooperative. Patient is in no acute distress.  Skin: Skin is warm and dry. No rash noted.   Cardiovascular: Normal heart rate noted  Respiratory: Normal respiratory effort, no problems with respiration noted  Abdomen: Soft, gravid, appropriate for gestational age.  Pain/Pressure: Present     Pelvic: Cervical exam deferred        Extremities: Normal range of motion.  Edema: None  Mental Status: Normal mood and affect. Normal behavior. Normal judgment and thought content.   Assessment and Plan:  Pregnancy: G6P5005 at [redacted]w[redacted]d 1. Supervision of high risk pregnancy, antepartum FHT and FH normal  2. Grand multiparity  3. Language barrier Interpreter used  4. Multigravida of advanced maternal age in third trimester  5. H/o LEEP  Preterm labor symptoms and general obstetric precautions including but not limited to vaginal  bleeding, contractions, leaking of fluid and fetal movement were reviewed in detail with the patient. Please refer to After Visit Summary for other counseling recommendations.   Return in about 2 weeks (around 03/28/2019) for HR OB f/u, In Office.  Future Appointments  Date Time Provider Department Center  03/28/2019  9:15 AM [place] Bing, MD [facility]  03/28/2019 10:15 AM [facility]    Levie Heritage, DO

## 2019-03-28 ENCOUNTER — Ambulatory Visit (INDEPENDENT_AMBULATORY_CARE_PROVIDER_SITE_OTHER): Payer: Medicaid Other | Admitting: *Deleted

## 2019-03-28 ENCOUNTER — Ambulatory Visit: Payer: Self-pay

## 2019-03-28 ENCOUNTER — Other Ambulatory Visit (HOSPITAL_COMMUNITY)
Admission: RE | Admit: 2019-03-28 | Discharge: 2019-03-28 | Disposition: A | Discharge status: Home / Self Care | Payer: Medicaid Other | Source: Ambulatory Visit | Attending: Obstetrics and Gynecology | Admitting: Obstetrics and Gynecology

## 2019-03-28 ENCOUNTER — Ambulatory Visit (INDEPENDENT_AMBULATORY_CARE_PROVIDER_SITE_OTHER): Payer: Medicaid Other | Admitting: Obstetrics and Gynecology

## 2019-03-28 ENCOUNTER — Encounter: Payer: Self-pay | Admitting: *Deleted

## 2019-03-28 ENCOUNTER — Other Ambulatory Visit: Payer: Self-pay

## 2019-03-28 VITALS — BP 97/73 | HR 110 | Wt 173.9 lb

## 2019-03-28 DIAGNOSIS — O0990 Supervision of high risk pregnancy, unspecified, unspecified trimester: Secondary | ICD-10-CM | POA: Insufficient documentation

## 2019-03-28 DIAGNOSIS — Z3A36 36 weeks gestation of pregnancy: Secondary | ICD-10-CM

## 2019-03-28 DIAGNOSIS — O09523 Supervision of elderly multigravida, third trimester: Secondary | ICD-10-CM | POA: Diagnosis not present

## 2019-03-28 DIAGNOSIS — Z9889 Other specified postprocedural states: Secondary | ICD-10-CM

## 2019-03-28 DIAGNOSIS — O3443 Maternal care for other abnormalities of cervix, third trimester: Secondary | ICD-10-CM

## 2019-03-28 DIAGNOSIS — O0993 Supervision of high risk pregnancy, unspecified, third trimester: Secondary | ICD-10-CM

## 2019-03-28 DIAGNOSIS — Z641 Problems related to multiparity: Secondary | ICD-10-CM

## 2019-03-28 DIAGNOSIS — Z789 Other specified health status: Secondary | ICD-10-CM

## 2019-03-28 NOTE — Progress Notes (Signed)
BTL consent signed.   Fleet Contras RN 03/28/19

## 2019-03-28 NOTE — Progress Notes (Signed)
Prenatal Visit Note Date: 03/28/2019 Clinic: [facility]  Subjective:  Leah Fernandez is a 44 y.o. [id number] at [redacted]w[redacted]d being seen today for ongoing prenatal care.  She is currently monitored for the following issues for this high-risk pregnancy and has CIN II (cervical intraepithelial neoplasia II); AMA (advanced maternal age) multigravida 35+; Supervision of high risk pregnancy, antepartum; History of LEEP (loop electrosurgical excision procedure) of cervix complicating pregnancy; Language barrier; and [place] multiparity on their problem list.  Patient reports occasional contractions.   Contractions: Irregular. Vag. Bleeding: None.  Movement: Present. Denies leaking of fluid.   The following portions of the patient's history were reviewed and updated as appropriate: allergies, current medications, past family history, past medical history, past social history, past surgical history and problem list. Problem list updated.  Objective:   Vitals:   03/28/19 0916  BP: 97/73  Pulse: (!) 110  Weight: 173 lb 14.4 oz (78.9 kg)    Fetal Status: Fetal Heart Rate (bpm): 144 Fundal Height: 36 cm Movement: Present  Presentation: Vertex  General:  Alert, oriented and cooperative. Patient is in no acute distress.  Skin: Skin is warm and dry. No rash noted.   Cardiovascular: Normal heart rate noted  Respiratory: Normal respiratory effort, no problems with respiration noted  Abdomen: Soft, gravid, appropriate for gestational age. Pain/Pressure: Present     Pelvic:  Cervical exam performed Dilation: 1 Effacement (%): 50    Extremities: Normal range of motion.  Edema: Trace  Mental Status: Normal mood and affect. Normal behavior. Normal judgment and thought content.   Urinalysis:      Assessment and Plan:  Pregnancy: G6P5005 at [redacted]w[redacted]d  1. Supervision of high risk pregnancy, antepartum Routine care. As of 1/4, pt has medicaid. D/w her re: inpatient options and BTL. Will sign  BTL papers today but I d/w her re: 30d wait period; she does desire a BTL. Can figure out next visit if 30d doesn't put her past her EDC and could schedule her with delivery to make her eligible for BTL.  - GC/Chlamydia probe amp ([place])not at [facility] - Culture, beta strep (group b only)  2. Grand multiparity  3. Multigravida of advanced maternal age in third trimester Nst/bpp today. Needs growth u/s and she states she doesn't have one scheduled Will schedule one for sometime in next 10 days either with [facility] or [place] (depending on which one is sooner). FH normal  4. History of loop electrosurgical excision procedure (LEEP) of cervix affecting pregnancy in third trimester No issues  5. Language barrier Interpreter used  Preterm labor symptoms and general obstetric precautions including but not limited to vaginal bleeding, contractions, leaking of fluid and fetal movement were reviewed in detail with the patient. Please refer to After Visit Summary for other counseling recommendations.  Return in about 1 week (around 04/04/2019) for high risk, in person, nst/bpp with diane.   [place] Bing, MD

## 2019-03-29 LAB — GC/CHLAMYDIA PROBE AMP (~~LOC~~) NOT AT ARMC
Chlamydia: NEGATIVE
Comment: NEGATIVE
Comment: NORMAL
Neisseria Gonorrhea: NEGATIVE

## 2019-03-30 ENCOUNTER — Other Ambulatory Visit: Payer: Self-pay

## 2019-03-30 ENCOUNTER — Inpatient Hospital Stay (HOSPITAL_COMMUNITY)
Admission: EM | Admit: 2019-03-30 | Discharge: 2019-04-01 | DRG: 831 | Disposition: A | Discharge status: Home / Self Care | Payer: Medicaid Other | Attending: Obstetrics & Gynecology | Admitting: Obstetrics & Gynecology

## 2019-03-30 ENCOUNTER — Encounter (HOSPITAL_COMMUNITY): Payer: Self-pay | Admitting: Emergency Medicine

## 2019-03-30 DIAGNOSIS — O98513 Other viral diseases complicating pregnancy, third trimester: Principal | ICD-10-CM | POA: Diagnosis present

## 2019-03-30 DIAGNOSIS — Z3A36 36 weeks gestation of pregnancy: Secondary | ICD-10-CM

## 2019-03-30 DIAGNOSIS — O0990 Supervision of high risk pregnancy, unspecified, unspecified trimester: Secondary | ICD-10-CM

## 2019-03-30 DIAGNOSIS — U071 COVID-19: Secondary | ICD-10-CM | POA: Diagnosis present

## 2019-03-30 DIAGNOSIS — O09529 Supervision of elderly multigravida, unspecified trimester: Secondary | ICD-10-CM

## 2019-03-30 DIAGNOSIS — O479 False labor, unspecified: Secondary | ICD-10-CM

## 2019-03-30 DIAGNOSIS — M791 Myalgia, unspecified site: Secondary | ICD-10-CM | POA: Diagnosis not present

## 2019-03-30 DIAGNOSIS — O09523 Supervision of elderly multigravida, third trimester: Secondary | ICD-10-CM

## 2019-03-30 DIAGNOSIS — O0943 Supervision of pregnancy with grand multiparity, third trimester: Secondary | ICD-10-CM

## 2019-03-30 HISTORY — DX: Unspecified asthma, uncomplicated: J45.909

## 2019-03-30 NOTE — ED Triage Notes (Signed)
Patient reports fever , chills, muscle aches and fatigue onset Monday this week , patient added loss of taste and smell today , she is [redacted] weeks pregnant G6P5 , denies abdominal cramping or /vaginal bleeding/PROM.

## 2019-03-30 NOTE — ED Provider Notes (Signed)
[facility] EMERGENCY DEPARTMENT Provider Note   CSN: [id number] Arrival date & time: 03/30/19  2026     History Chief Complaint  Patient presents with  . Covid Symptoms/[redacted] weeks Pregnant    Leah Fernandez is a 44 y.o. female who is G6P5 currently [redacted]w[redacted]d pregnant presents to the ED with complaints of covid like sxs x 3 days & contractions x 4 days.   Patient reports covid like sxs including loss of taste/smell, subjective fever, chills, body aches, headaches, nasal congestion & mild cough productive of green phelgm sputum. No alleviating/aggravating factors to her sxs. No known covid 19 exposures. Denies dyspnea, chest pain, or leg pain/swelling.   Patient states she has been having intermittent contractions, somewhat irregular, usually 1 per hour, worse when standing/walking with increase in frequency to every 30 minutes, started to contract in the lobby while waiting for room. States yesterday she did have leakage of yellow/white fluid. She denies current leakage/discharge. Denies vaginal bleeding. States she was told she was 1.5 cm dilated @ OB appointment 03/28/19, plan for continued monitoring.   She is currently monitored for the following issues for this high-risk pregnancy and has CIN II (cervical intraepithelial neoplasia II); AMA (advanced maternal age) multigravida 35+; Supervision of high risk pregnancy, antepartum; History of LEEP (loop electrosurgical excision procedure) of cervix complicating pregnancy. She is A positive in terms of blood type.   Interpretor utilized throughout Audiological scientist.   HPI     Past Medical History:  Diagnosis Date  . Cervix dysplasia     Patient Active Problem List   Diagnosis Date Noted  . Grand multiparity 11/04/2018  . AMA (advanced maternal age) multigravida 35+ 11/03/2018  . Supervision of high risk pregnancy, antepartum 11/03/2018  . History of LEEP (loop electrosurgical excision procedure) of cervix complicating  pregnancy 11/03/2018  . Language barrier 11/03/2018  . CIN II (cervical intraepithelial neoplasia II) 07/29/2013    Past Surgical History:  Procedure Laterality Date  . LEEP       OB History    Gravida  6   Para  5   Term  5   Preterm  0   AB  0   Living  5     SAB  0   TAB  0   Ectopic  0   Multiple  0   Live Births  5           Family History  Problem Relation Age of Onset  . Diabetes Mother   . Hypertension Sister   . Cancer Sister     Social History   Tobacco Use  . Smoking status: Never Smoker  . Smokeless tobacco: Never Used  Substance Use Topics  . Alcohol use: No  . Drug use: No    Home Medications Prior to Admission medications   Medication Sig Start Date End Date Taking? Authorizing Provider  aspirin EC 81 MG tablet Take 1 tablet (81 mg total) by mouth daily. 11/03/18   Seabrook Bing, MD  prenatal vitamin w/FE, FA (PRENATAL 1 + 1) 27-1 MG TABS tablet Take 1 tablet by mouth daily at 12 noon.    [provider]    Allergies    Patient has no known allergies.  Review of Systems   Review of Systems  Constitutional: Positive for chills and fever.       Positive for loss of taste/smell.   HENT: Positive for congestion. Negative for ear pain and sore throat.   Respiratory:  Positive for cough. Negative for shortness of breath.   Cardiovascular: Negative for chest pain.  Gastrointestinal: Positive for abdominal pain (contractions).  Genitourinary: Positive for vaginal discharge (x 1 leakage yesterday). Negative for vaginal bleeding.  Musculoskeletal: Positive for myalgias.  Neurological: Negative for syncope.  All other systems reviewed and are negative.   Physical Exam Updated Vital Signs BP 105/64 (BP Location: Right Arm)   Pulse (!) 116   Temp 98.4 F (36.9 C) (Oral)   Resp 18   LMP 07/18/2018 (Exact Date)   SpO2 100%   Physical Exam Vitals and nursing note reviewed.  Constitutional:      General: She is not  in acute distress.    Appearance: She is well-developed. She is not toxic-appearing.  HENT:     Head: Normocephalic and atraumatic.  Eyes:     General:        Right eye: No discharge.        Left eye: No discharge.     Conjunctiva/sclera: Conjunctivae normal.  Cardiovascular:     Rate and Rhythm: Regular rhythm. Tachycardia present.  Pulmonary:     Effort: Pulmonary effort is normal. No respiratory distress.     Breath sounds: Normal breath sounds. No wheezing, rhonchi or rales.  Abdominal:     Comments: Gravid abdomen.   Musculoskeletal:        General: No tenderness.     Cervical back: Neck supple.     Right lower leg: No edema.     Left lower leg: No edema.  Skin:    General: Skin is warm and dry.     Findings: No rash.  Neurological:     Mental Status: She is alert.     Comments: Clear speech.   Psychiatric:        Behavior: Behavior normal.    ED Results / Procedures / Treatments   Labs (all labs ordered are listed, but only abnormal results are displayed) Labs Reviewed  CBC WITH DIFFERENTIAL/PLATELET - Abnormal; Notable for the following components:      Result Value   Hemoglobin 11.2 (*)    HCT 33.2 (*)    All other components within normal limits  POC SARS CORONAVIRUS 2 AG -  ED - Abnormal; Notable for the following components:   SARS Coronavirus 2 Ag POSITIVE (*)    All other components within normal limits  COMPREHENSIVE METABOLIC PANEL  URINALYSIS, ROUTINE W REFLEX MICROSCOPIC  I-STAT CHEM 8, ED    EKG None  Radiology DG Chest Portable 1 View  Result Date: 03/31/2019 CLINICAL DATA:  Cough. Fever and chills. COVID positive. Thirty-six weeks pregnant. EXAM: PORTABLE CHEST 1 VIEW COMPARISON:  None. FINDINGS: The cardiomediastinal contours are normal. Subsegmental opacity at the right lung base. Pulmonary vasculature is normal. No confluent consolidation, pleural effusion, or pneumothorax. No acute osseous abnormalities are seen. IMPRESSION: Subsegmental  opacity at the right lung base, typically atelectasis, may represent pneumonia in the setting of COVID infection. Electronically Signed   By: Keith Rake M.D.   On: 03/31/2019 01:15    Procedures .Critical Care Performed by: Amaryllis Dyke, PA-C Authorized by: Amaryllis Dyke, PA-C    CRITICAL CARE Performed by: Kennith Maes   Total critical care time: 50 minutes  Critical care time was exclusive of separately billable procedures and treating other patients.  Critical care was necessary to treat or prevent imminent or life-threatening deterioration.  Critical care was time spent personally by me on the following activities: development  of treatment plan with patient and/or surrogate as well as nursing, discussions with consultants, evaluation of patient's response to treatment, examination of patient, obtaining history from patient or surrogate, ordering and performing treatments and interventions, ordering and review of laboratory studies, ordering and review of radiographic studies, pulse oximetry and re-evaluation of patient's condition.    (including critical care time)  Medications Ordered in ED Medications  terbutaline (BRETHINE) injection 0.25 mg (has no administration in time range)  betamethasone acetate-betamethasone sodium phosphate (CELESTONE) injection 12 mg (has no administration in time range)  lactated ringers bolus 500 mL (0 mLs Intravenous Stopped 03/31/19 0148)  acetaminophen (TYLENOL) tablet 650 mg (650 mg Oral Given 03/31/19 0107)    ED Course  I have reviewed the triage vital signs and the nursing notes.  Pertinent labs & imaging results that were available during my care of the patient were reviewed by me and considered in my medical decision making (see chart for details).    Leah Fernandez was evaluated in Emergency Department on 03/31/2019 for the symptoms described in the history of present illness. He/she was evaluated in  the context of the global COVID-19 pandemic, which necessitated consideration that the patient might be at risk for infection with the SARS-CoV-2 virus that causes COVID-19. Institutional protocols and algorithms that pertain to the evaluation of patients at risk for COVID-19 are in a state of rapid change based on information released by regulatory bodies including the CDC and federal and state organizations. These policies and algorithms were followed during the patient's care in the ED.  MDM Rules/Calculators/A&P                      Patient presents to the ED with concern for covid sxs x 3 days, also is [redacted]w[redacted]d pregnant and having contractions- reports currently 30 minutes apart.   Febrile with likely resultant tachycardia, BP WNL.  Lungs CTA.  Gravid abdomen.  Rapid OB response nurse paged by ED nursing team.  Rh positive.   00:25: Rapid OB concern for contractions, pending additional instructions.   Rapid COVID is positive, suppository tylenol ordered for fever, suspect tachycardia is secondary to fever as opposed to PE- patient is not hypoxic or complaining of dyspnea/chest pain. CXR w/ atelectasis vs. Pneumonia in setting of COVID infection. From a covid standpoint overall reassuring respiratory status currently, not in respiratory distress or hypoxic, lungs are clear.   CBC: mild anemia fairly similar to prior.  CMP: mild electrolyte abnormalities as above. Renal function WNL. LFTs WNL.   01:20: HR improved to 111. Re-discussed w/ OB RN- initially discussed w/ OB who recommended tylenol & LR which were each initiated, now that more results returning plan to recheck temp and rediscuss w/ OB. Per OB RN needs fetal monitoring, having contractions, baby is tachycardic, finger width dilation.  2:15: Re-discussed with OB RN- pending plan of disposition- L&D vs. Floor for monitoring. Plan for betamethasone & terbutaline.   02:48: CONSULT: Discussed with OB Dr. Jolayne Panther- does not appear to be  actively laboring, plan to admit to family medicine, OB to consult.   02:53: Re-discussed with OB RN- per Dr. Jolayne Panther- while admitted Procardia XL 30 BID & NST BID--> orders placed per discussion.   02:58: CONSULT: Discussed with family medicine practice- accept admission.   This is a shared visit with supervising physician Dr. Nicanor Alcon who has independently evaluated patient & provided guidance in evaluation/management/disposition, in agreement with care   Final Clinical Impression(s) / ED  Diagnoses Final diagnoses:  COVID-19  Uterine contractions during pregnancy    Rx / DC Orders ED Discharge Orders    None       Cherly Anderson, PA-C 03/31/19 0335    Palumbo, April, MD 03/31/19 [phone]

## 2019-03-31 ENCOUNTER — Inpatient Hospital Stay (HOSPITAL_COMMUNITY): Payer: Medicaid Other

## 2019-03-31 ENCOUNTER — Encounter (HOSPITAL_COMMUNITY): Payer: Self-pay

## 2019-03-31 ENCOUNTER — Emergency Department (HOSPITAL_COMMUNITY): Payer: Medicaid Other

## 2019-03-31 DIAGNOSIS — U071 COVID-19: Secondary | ICD-10-CM | POA: Diagnosis not present

## 2019-03-31 DIAGNOSIS — O09523 Supervision of elderly multigravida, third trimester: Secondary | ICD-10-CM | POA: Diagnosis not present

## 2019-03-31 DIAGNOSIS — R05 Cough: Secondary | ICD-10-CM | POA: Diagnosis not present

## 2019-03-31 DIAGNOSIS — Z3A36 36 weeks gestation of pregnancy: Secondary | ICD-10-CM

## 2019-03-31 DIAGNOSIS — R918 Other nonspecific abnormal finding of lung field: Secondary | ICD-10-CM | POA: Diagnosis not present

## 2019-03-31 DIAGNOSIS — O98513 Other viral diseases complicating pregnancy, third trimester: Principal | ICD-10-CM

## 2019-03-31 DIAGNOSIS — R509 Fever, unspecified: Secondary | ICD-10-CM | POA: Diagnosis not present

## 2019-03-31 DIAGNOSIS — O0943 Supervision of pregnancy with grand multiparity, third trimester: Secondary | ICD-10-CM | POA: Diagnosis not present

## 2019-03-31 LAB — CBC WITH DIFFERENTIAL/PLATELET
Abs Immature Granulocytes: 0.03 10*3/uL (ref 0.00–0.07)
Basophils Absolute: 0.1 10*3/uL (ref 0.0–0.1)
Basophils Relative: 1 %
Eosinophils Absolute: 0 10*3/uL (ref 0.0–0.5)
Eosinophils Relative: 0 %
HCT: 33.2 % — ABNORMAL LOW (ref 36.0–46.0)
Hemoglobin: 11.2 g/dL — ABNORMAL LOW (ref 12.0–15.0)
Immature Granulocytes: 0 %
Lymphocytes Relative: 21 %
Lymphs Abs: 1.6 10*3/uL (ref 0.7–4.0)
MCH: 27.3 pg (ref 26.0–34.0)
MCHC: 33.7 g/dL (ref 30.0–36.0)
MCV: 80.8 fL (ref 80.0–100.0)
Monocytes Absolute: 0.6 10*3/uL (ref 0.1–1.0)
Monocytes Relative: 8 %
Neutro Abs: 5.6 10*3/uL (ref 1.7–7.7)
Neutrophils Relative %: 70 %
Platelets: 346 10*3/uL (ref 150–400)
RBC: 4.11 MIL/uL (ref 3.87–5.11)
RDW: 13.7 % (ref 11.5–15.5)
WBC: 7.9 10*3/uL (ref 4.0–10.5)
nRBC: 0 % (ref 0.0–0.2)

## 2019-03-31 LAB — COMPREHENSIVE METABOLIC PANEL
ALT: 13 U/L (ref 0–44)
ALT: 14 U/L (ref 0–44)
AST: 21 U/L (ref 15–41)
AST: 18 U/L (ref 15–41)
Albumin: 2.6 g/dL — ABNORMAL LOW (ref 3.5–5.0)
Albumin: 2.6 g/dL — ABNORMAL LOW (ref 3.5–5.0)
Alkaline Phosphatase: 113 U/L (ref 38–126)
Alkaline Phosphatase: 106 U/L (ref 38–126)
Anion gap: 9 (ref 5–15)
Anion gap: 9 (ref 5–15)
BUN: 6 mg/dL (ref 6–20)
BUN: 5 mg/dL — ABNORMAL LOW (ref 6–20)
CO2: 17 mmol/L — ABNORMAL LOW (ref 22–32)
CO2: 17 mmol/L — ABNORMAL LOW (ref 22–32)
Calcium: 8.3 mg/dL — ABNORMAL LOW (ref 8.9–10.3)
Calcium: 8.3 mg/dL — ABNORMAL LOW (ref 8.9–10.3)
Chloride: 107 mmol/L (ref 98–111)
Chloride: 109 mmol/L (ref 98–111)
Creatinine, Ser: 0.57 mg/dL (ref 0.44–1.00)
Creatinine, Ser: 0.51 mg/dL (ref 0.44–1.00)
GFR calc Af Amer: >60 mL/min (ref >60)
GFR calc Af Amer: >60 mL/min (ref >60)
GFR calc non Af Amer: >60 mL/min (ref >60)
GFR calc non Af Amer: >60 mL/min (ref >60)
Glucose, Bld: 107 mg/dL — ABNORMAL HIGH (ref 70–99)
Glucose, Bld: 125 mg/dL — ABNORMAL HIGH (ref 70–99)
Potassium: 3.3 mmol/L — ABNORMAL LOW (ref 3.5–5.1)
Potassium: 3.3 mmol/L — ABNORMAL LOW (ref 3.5–5.1)
Sodium: 133 mmol/L — ABNORMAL LOW (ref 135–145)
Sodium: 135 mmol/L (ref 135–145)
Total Bilirubin: 0.5 mg/dL (ref 0.3–1.2)
Total Bilirubin: 0.5 mg/dL (ref 0.3–1.2)
Total Protein: 6.1 g/dL — ABNORMAL LOW (ref 6.5–8.1)
Total Protein: 5.9 g/dL — ABNORMAL LOW (ref 6.5–8.1)

## 2019-03-31 LAB — URINALYSIS, ROUTINE W REFLEX MICROSCOPIC
Bilirubin Urine: NEGATIVE
Glucose, UA: NEGATIVE mg/dL
Hgb urine dipstick: NEGATIVE
Ketones, ur: NEGATIVE mg/dL
Nitrite: NEGATIVE
Protein, ur: NEGATIVE mg/dL
Specific Gravity, Urine: 1.012 (ref 1.005–1.030)
pH: 6 (ref 5.0–8.0)

## 2019-03-31 LAB — FERRITIN: Ferritin: 9 ng/mL — ABNORMAL LOW (ref 11–307)

## 2019-03-31 LAB — CBC
HCT: 32.8 % — ABNORMAL LOW (ref 36.0–46.0)
Hemoglobin: 10.6 g/dL — ABNORMAL LOW (ref 12.0–15.0)
MCH: 26.9 pg (ref 26.0–34.0)
MCHC: 32.3 g/dL (ref 30.0–36.0)
MCV: 83.2 fL (ref 80.0–100.0)
Platelets: 322 10*3/uL (ref 150–400)
RBC: 3.94 MIL/uL (ref 3.87–5.11)
RDW: 13.8 % (ref 11.5–15.5)
WBC: 8.6 10*3/uL (ref 4.0–10.5)
nRBC: 0 % (ref 0.0–0.2)

## 2019-03-31 LAB — I-STAT CHEM 8, ED
BUN: 4 mg/dL — ABNORMAL LOW (ref 6–20)
Calcium, Ion: 1.14 mmol/L — ABNORMAL LOW (ref 1.15–1.40)
Chloride: 106 mmol/L (ref 98–111)
Creatinine, Ser: 0.4 mg/dL — ABNORMAL LOW (ref 0.44–1.00)
Glucose, Bld: 120 mg/dL — ABNORMAL HIGH (ref 70–99)
HCT: 32 % — ABNORMAL LOW (ref 36.0–46.0)
Hemoglobin: 10.9 g/dL — ABNORMAL LOW (ref 12.0–15.0)
Potassium: 3.4 mmol/L — ABNORMAL LOW (ref 3.5–5.1)
Sodium: 137 mmol/L (ref 135–145)
TCO2: 18 mmol/L — ABNORMAL LOW (ref 22–32)

## 2019-03-31 LAB — D-DIMER, QUANTITATIVE: D-Dimer, Quant: 0.94 ug/mL-FEU — ABNORMAL HIGH (ref 0.00–0.50)

## 2019-03-31 LAB — ABO/RH: ABO/RH(D): A POS

## 2019-03-31 LAB — LACTATE DEHYDROGENASE: LDH: 108 U/L (ref 98–192)

## 2019-03-31 LAB — POC SARS CORONAVIRUS 2 AG -  ED: SARS Coronavirus 2 Ag: POSITIVE — AB

## 2019-03-31 LAB — C-REACTIVE PROTEIN: CRP: 2 mg/dL — ABNORMAL HIGH (ref <1.0)

## 2019-03-31 LAB — PROCALCITONIN: Procalcitonin: <0.1 ng/mL

## 2019-03-31 MED ORDER — BETAMETHASONE SOD PHOS & ACET 6 (3-3) MG/ML IJ SUSP
12.0000 mg | Freq: Once | INTRAMUSCULAR | Status: AC
  Administered 2019-03-31: 12 mg via INTRAMUSCULAR
  Filled 2019-03-31: qty 5

## 2019-03-31 MED ORDER — ACETAMINOPHEN 325 MG PO TABS
650.0000 mg | ORAL_TABLET | Freq: Once | ORAL | Status: AC
  Administered 2019-03-31: 650 mg via ORAL

## 2019-03-31 MED ORDER — ACETAMINOPHEN 325 MG PO TABS: 650.0000 mg | ORAL_TABLET | Freq: Four times a day (QID) | ORAL | Status: DC | PRN

## 2019-03-31 MED ORDER — SODIUM CHLORIDE 0.9 % IV BOLUS: 1000.0000 mL | Freq: Once | INTRAVENOUS | Status: DC

## 2019-03-31 MED ORDER — ACETAMINOPHEN 650 MG RE SUPP: 650.0000 mg | Freq: Once | RECTAL | Status: DC

## 2019-03-31 MED ORDER — ACETAMINOPHEN 325 MG PO TABS
650.0000 mg | ORAL_TABLET | Freq: Once | ORAL | Status: AC
  Administered 2019-03-31: 12:00:00 650 mg via ORAL
  Filled 2019-03-31: qty 2

## 2019-03-31 MED ORDER — SODIUM CHLORIDE 0.9 % IV SOLN: INTRAVENOUS | Status: DC

## 2019-03-31 MED ORDER — BETAMETHASONE SOD PHOS & ACET 6 (3-3) MG/ML IJ SUSP
12.0000 mg | INTRAMUSCULAR | Status: DC
  Administered 2019-03-31: 03:00:00 12 mg via INTRAMUSCULAR
  Filled 2019-03-31: qty 2

## 2019-03-31 MED ORDER — HEPARIN SODIUM (PORCINE) 5000 UNIT/ML IJ SOLN
5000.0000 [IU] | Freq: Three times a day (TID) | INTRAMUSCULAR | Status: DC
  Administered 2019-03-31 – 2019-04-01 (×4): 5000 [IU] via SUBCUTANEOUS
  Filled 2019-03-31 (×4): qty 1

## 2019-03-31 MED ORDER — ACETAMINOPHEN 325 MG PO TABS
650.0000 mg | ORAL_TABLET | Freq: Four times a day (QID) | ORAL | Status: DC | PRN
  Administered 2019-03-31: 650 mg via ORAL
  Filled 2019-03-31: qty 2

## 2019-03-31 MED ORDER — ASPIRIN EC 81 MG PO TBEC
81.0000 mg | DELAYED_RELEASE_TABLET | Freq: Every day | ORAL | Status: DC
  Administered 2019-03-31 – 2019-04-01 (×2): 81 mg via ORAL
  Filled 2019-03-31 (×2): qty 1

## 2019-03-31 MED ORDER — NIFEDIPINE ER OSMOTIC RELEASE 30 MG PO TB24
30.0000 mg | ORAL_TABLET | Freq: Two times a day (BID) | ORAL | Status: DC
  Administered 2019-03-31: 09:00:00 30 mg via ORAL
  Filled 2019-03-31 (×2): qty 1

## 2019-03-31 MED ORDER — LACTATED RINGERS IV BOLUS
500.0000 mL | Freq: Once | INTRAVENOUS | Status: AC
  Administered 2019-03-31: 01:00:00 500 mL via INTRAVENOUS

## 2019-03-31 MED ORDER — CYCLOBENZAPRINE HCL 10 MG PO TABS: 10.0000 mg | ORAL_TABLET | Freq: Three times a day (TID) | ORAL | Status: DC | PRN

## 2019-03-31 MED ORDER — TERBUTALINE SULFATE 1 MG/ML IJ SOLN
0.2500 mg | Freq: Once | INTRAMUSCULAR | Status: AC
  Administered 2019-03-31: 03:00:00 0.25 mg via SUBCUTANEOUS
  Filled 2019-03-31: qty 1

## 2019-03-31 NOTE — ED Notes (Signed)
Notified MD of current COVID readout

## 2019-03-31 NOTE — H&P (Addendum)
Family Medicine [facility] Admission History and Physical Service Pager: [phone]  Patient name: Leah Fernandez Medical record number: [id number] Date of birth: 12/12/1975 Age: 44 y.o. Gender: female  Primary Care Provider: Tereso Newcomer, MD Consultants: OB-GYN Code Status: Full  Preferred Emergency Contact: Daughter Sharen Hones [phone]   Chief Complaint: COVID  Spanish translator used   Assessment and Plan: Leah Fernandez is a 44 y.o. female presenting with Covid symptoms at 36 weeks 4 days gravid. PMH is significant for P6G5005 at advanced maternal age  COVID  Tachycardia   Patient presented to the ED with Covid symptoms (myalgias, chills, cough, loss of taste and smell) that started 3 days ago (1/18) and Covid testing was positive.  Patient is a [id number] at [redacted]w[redacted]d. Patient remained tachycardic throughout ED presentation, with max recorded HR of 125. CXR indicated subsegmental opacity at the right lung base concerning for Covid pneumonia vs atelectasis.  No leukocytosis (WBC 7.9), documented fever, hypoxia or complaints of dyspnea. No empiric antibiotics at this time. Tachycardia ikely 2/2 some dehydration and viral process 2/2 COVID. HR does appear to be improving after LR bolus and tylenol. EKG pending, but monitor showing sinus tachycardia. Unlikely PE given no respiratory complaints and adequate O2 saturation on room air, however, patient is hypercoagulable 2/2 pregnancy and COVID diagnosis. Well's sore 1.5 2/2 HR.  -Admit to MedSurg Covid floor as patient is not in active labor, attending Dr. Pollie Meyer -OB-Gyn following, appreciate recommendations -Every 12 hours NSTs -Procardia XL 30 mg twice daily -Heparin for VTE prophylactics -Maintain oxygen saturations above 94% -Monitor respiratory status -Follow-up AM CBC, CMP, D-dimer, LDH, ferritin, OB urine culture  -Consider remdesivir and Decadron if respiratory status declines -AM EKG -vitals  q4h -if respiratory symptoms or O2 requirement can consider remdesiver/dexamethasone   High Risk Pregnancy @ [redacted]w[redacted]d Managed by OB while inpatient. Dated by LMP per MFM. Seen by [facility]. Pregnancy complications include AMA with grand multiparity, h/o CIN II with LEEP. Reports good fetal movement, lack of fluid leakage or blood. OB consulted in ED. Advised admission for further monitoring. Recommended betamethasone as well as tocolysis with procardia. Also advised bid NST. Pt was evaluated by the [facility] RN for fetal tachycardia. Patient was having intermittent contractions but no active labor.  She reports fetal movement and intermittent contractions but denies loss of fluid and vaginal bleeding.  -OB consulted, appreciate recommendations  -NST bid -tocolysis per OB  -betamethasone per OB -ASA 81mg   Leukocytes on UA Urinalysis with trace leukocytes and rare bacteria.  Patient denies dysuria. No culture obtained. Have requested lab to add on culture but they stated a new sample would need to be collected. If culture is positive will plan to treat for asymptomatic bacteruria in pregnancy to avoid pre-term labor -OB urine cx -have informed RN of need for urine collection  -will plan to treat if cx is positive  FEN/GI: Regular diet, Replete electrolytes as necessary  Prophylaxis: Heparin   Disposition: Admit to med-surg   History of Present Illness:   Spanish translator used    Leah Fernandez is a 44 y.o.  [phone] [redacted]w[redacted]d pregnant female who presented to the ED with symptoms of covid.   Patient reported loss of taste and smell, myalgias, productive cough without bloody sputum, chills, body aches, headache, nasal congestion that began on Monday (1/18). Denies shortness of breath and chest pain. No documented fever. Patient lives at home with her 5 children, one of which attends school in person.  Her eldest daughter, age 58, is watching her children. Denies any recent sick contacts or known Covid  exposures. States she recently left the home to go to the grocery store and doctor's appointments.   On arrival to the ED, patient is tachycardic (HR 120s) complaining of contractions.  Patient was evaluated by [facility] RN who found fetal tachycardia and contractions however patient was determined not to be in active labor.  OB recommended observation for Covid as well fetal non-stress test.      Review Of Systems: Per HPI with the following additions:   Review of Systems  Constitutional: Negative for fever.  HENT: Positive for congestion.        Loss of taste, loss of smell   Respiratory: Positive for cough. Negative for hemoptysis and shortness of breath.   Cardiovascular: Negative for chest pain.  Gastrointestinal: Positive for nausea. Negative for abdominal pain and vomiting.  Genitourinary: Negative for dysuria.       +Contractions, no loss of fluid, no vaginal bleeding   Musculoskeletal: Positive for myalgias.  Neurological: Positive for headaches.    Patient Active Problem List   Diagnosis Date Noted  . [place] multiparity 11/04/2018  . AMA (advanced maternal age) multigravida 35+ 11/03/2018  . Supervision of high risk pregnancy, antepartum 11/03/2018  . History of LEEP (loop electrosurgical excision procedure) of cervix complicating pregnancy 02/40/9735  . Language barrier 11/03/2018  . CIN II (cervical intraepithelial neoplasia II) 07/29/2013    Past Medical History: Past Medical History:  Diagnosis Date  . Cervix dysplasia     Past Surgical History: Past Surgical History:  Procedure Laterality Date  . LEEP      Social History: Social History   Tobacco Use  . Smoking status: Never Smoker  . Smokeless tobacco: Never Used  Substance Use Topics  . Alcohol use: No  . Drug use: No   Additional social history:   Please also refer to relevant sections of EMR.  Family History: Family History  Problem Relation Age of Onset  . Diabetes Mother   . Hypertension  Sister   . Cancer Sister      Allergies and Medications: No Known Allergies No current facility-administered medications on file prior to encounter.   Current Outpatient Medications on File Prior to Encounter  Medication Sig Dispense Refill  . aspirin EC 81 MG tablet Take 1 tablet (81 mg total) by mouth daily. (Patient not taking: Reported on 03/31/2019) 60 tablet 2    Objective: BP 121/71   Pulse (!) 103   Temp 99.2 F (37.3 C) (Temporal)   Resp 17   LMP 07/18/2018 (Exact Date)   SpO2 98%   Exam:  GEN:     alert, uncomfortable, ill-appearing female HENT:   Normocephalic, mucus membranes moist, nares patent, no nasal discharge  EYES:   pupils equal and reactive, EOM intact NECK:  supple, normal ROM RESP:  clear to auscultation bilaterally, no increased work of breathing  CVS:    tachycardic, regular rhythm, no murmur, distal pulses intact  ABD:  gravid abdomen, non-tender; bowel sounds present; palpable fetus externally, vertex per leopold EXT:   normal ROM, atraumatic NEURO:  normal without focal findings,  speech normal, alert and oriented   Skin:   warm and dry, no rash, normal skin turgor    Labs and Imaging: CBC BMET  Recent Labs  Lab 03/31/19 0013  WBC 7.9  HGB 11.2*  HCT 33.2*  PLT 346   Recent Labs  Lab 03/31/19 0013  NA 133*  K 3.3*  CL 107  CO2 17*  BUN 6  CREATININE 0.57  GLUCOSE 107*  CALCIUM 8.3*      DG Chest Portable 1 View  Result Date: 03/31/2019 CLINICAL DATA:  Cough. Fever and chills. COVID positive. Thirty-six weeks pregnant. EXAM: PORTABLE CHEST 1 VIEW COMPARISON:  None. FINDINGS: The cardiomediastinal contours are normal. Subsegmental opacity at the right lung base. Pulmonary vasculature is normal. No confluent consolidation, pleural effusion, or pneumothorax. No acute osseous abnormalities are seen. IMPRESSION: Subsegmental opacity at the right lung base, typically atelectasis, may represent pneumonia in the setting of COVID  infection. Electronically Signed   By: Narda Rutherford M.D.   On: 03/31/2019 01:15     Katha Cabal, DO 03/31/2019, 4:13 AM PGY-1, [facility] FPTS Intern pager: [phone], text pages welcome  FPTS Upper-Level Resident Addendum   I have independently interviewed and examined the patient. I have discussed the above with the original author and agree with their documentation. My edits for correction/addition/clarification are in blue. Please see also any attending notes.   Oralia Manis, DO PGY-3, [facility] 03/31/2019 5:05 AM  FPTS Service pager: [phone] (text pages welcome through [facility])

## 2019-03-31 NOTE — Progress Notes (Signed)
Called pt to check on meals, she said her husband will bring food today,  by Orlan Leavens Spanish interpreter.

## 2019-03-31 NOTE — Consult Note (Signed)
Reason for Consult:36 week pregnancy with covid symptoms  Leah Fernandez is an 44 y.o. female G6P5005 at 30w4dwho presented to the ED with a 3 day history of loss of smell & taste, fevers, chills, generalized body aches, headaches and nasal congestion. Patient denies shortness of breath or chest pain. Patient denies any known exposure to covid 19. Patient also reports intermittent contractions over the past week which increased in frequency today. She reports good fetal movement. She denies vaginal bleeding or leakage of fluid. [facility], grand multiparity and desire for sterilization.    Menstrual History: Patient's last menstrual period was 07/18/2018 (exact date).    Past Medical History:  Diagnosis Date  . Cervix dysplasia     Past Surgical History:  Procedure Laterality Date  . LEEP      Family History  Problem Relation Age of Onset  . Diabetes Mother   . Hypertension Sister   . Cancer Sister     Social History:  reports that she has never smoked. She has never used smokeless tobacco. She reports that she does not drink alcohol or use drugs.  Allergies: No Known Allergies   Review of Systems See pertinent in HPI. All other systems reviewed and negative Blood pressure 121/71, pulse (!) 125, temperature 99.2 F (37.3 C), temperature source Temporal, resp. rate 17, last menstrual period 07/18/2018, SpO2 99 %. Physical Exam GENERAL: Well-developed, well-nourished female in no acute distress.  LUNGS: Clear to auscultation bilaterally.  HEART: Regular rate and rhythm. ABDOMEN: Soft, nontender, gravid Cervical exam: 0.5/50/ ballotable (by Ob rapid response x2).  EXTREMITIES: No cyanosis, clubbing, or edema, 2+ distal pulses.  NST: baseline 150, mod variability, +accels, no decels Toco: contractions q3-4 minutes  Results for orders placed or performed during the hospital encounter of 03/30/19 (from the past 48 hour(s))   CBC with Differential     Status: Abnormal   Collection Time: 03/31/19 12:13 AM  Result Value Ref Range   WBC 7.9 4.0 - 10.5 K/uL   RBC 4.11 3.87 - 5.11 MIL/uL   Hemoglobin 11.2 (L) 12.0 - 15.0 g/dL   HCT 33.2 (L) 36.0 - 46.0 %   MCV 80.8 80.0 - 100.0 fL   MCH 27.3 26.0 - 34.0 pg   MCHC 33.7 30.0 - 36.0 g/dL   RDW 13.7 11.5 - 15.5 %   Platelets 346 150 - 400 K/uL   nRBC 0.0 0.0 - 0.2 %   Neutrophils Relative % 70 %   Neutro Abs 5.6 1.7 - 7.7 K/uL   Lymphocytes Relative 21 %   Lymphs Abs 1.6 0.7 - 4.0 K/uL   Monocytes Relative 8 %   Monocytes Absolute 0.6 0.1 - 1.0 K/uL   Eosinophils Relative 0 %   Eosinophils Absolute 0.0 0.0 - 0.5 K/uL   Basophils Relative 1 %   Basophils Absolute 0.1 0.0 - 0.1 K/uL   Immature Granulocytes 0 %   Abs Immature Granulocytes 0.03 0.00 - 0.07 K/uL    Comment: [facility]formed [facility] [address], [place] [id number] Comprehensive metabolic panel     Status: Abnormal   Collection Time: 03/31/19 12:13 AM  Result Value Ref Range   Sodium 133 (L) 135 - 145 mmol/L   Potassium 3.3 (L) 3.5 - 5.1 mmol/L   Chloride 107 98 - 111 mmol/L   CO2 17 (L) 22 - 32 mmol/L   Glucose, Bld 107 (H) 70 - 99  mg/dL   BUN 6 6 - 20 mg/dL   Creatinine, Ser 0.57 0.44 - 1.00 mg/dL   Calcium 8.3 (L) 8.9 - 10.3 mg/dL   Total Protein 6.1 (L) 6.5 - 8.1 g/dL   Albumin 2.6 (L) 3.5 - 5.0 g/dL   AST 21 15 - 41 U/L   ALT 13 0 - 44 U/L   Alkaline Phosphatase 113 38 - 126 U/L   Total Bilirubin 0.5 0.3 - 1.2 mg/dL   GFR calc non Af Amer >60 >60 mL/min   GFR calc Af Amer >60 >60 mL/min   Anion gap 9 5 - 15    Comment: Performed at [facility] [address] [place], [place] [address]  Urinalysis, Routine w reflex microscopic     Status: Abnormal   Collection Time: 03/31/19 12:25 AM  Result Value Ref Range   Color, Urine YELLOW YELLOW   APPearance CLEAR CLEAR   Specific Gravity, Urine 1.012 1.005 - 1.030   pH 6.0 5.0 - 8.0   Glucose, UA NEGATIVE  NEGATIVE mg/dL   Hgb urine dipstick NEGATIVE NEGATIVE   Bilirubin Urine NEGATIVE NEGATIVE   Ketones, ur NEGATIVE NEGATIVE mg/dL   Protein, ur NEGATIVE NEGATIVE mg/dL   Nitrite NEGATIVE NEGATIVE   Leukocytes,Ua TRACE (A) NEGATIVE   RBC / HPF 0-5 0 - 5 RBC/hpf   WBC, UA 0-5 0 - 5 WBC/hpf   Bacteria, UA RARE (A) NONE SEEN   Squamous Epithelial / LPF 0-5 0 - 5   Mucus PRESENT     Comment: Performed at [facility], [place] [address] [place], [place] [address]  POC SARS Coronavirus 2 Ag-ED - Nasal Swab (BD Veritor Kit)     Status: Abnormal   Collection Time: 03/31/19 12:53 AM  Result Value Ref Range   SARS Coronavirus 2 Ag POSITIVE (A) NEGATIVE    Comment: (NOTE) SARS-CoV-2 antigen PRESENT. Positive results indicate the presence of viral antigens, but clinical correlation with patient history and other diagnostic information is necessary to determine patient infection status.  Positive results do not rule out bacterial infection or co-infection  with other viruses. False positive results are rare but can occur, and confirmatory RT-PCR testing may be appropriate in some circumstances. The expected result is Negative. Fact Sheet for Patients: PodPark.tn Fact Sheet for Providers: GiftContent.is  This test is not yet approved or cleared by the [place] FDA and  has been authorized for detection and/or diagnosis of SARS-CoV-2 by FDA under an Emergency Use Authorization (EUA).  This EUA will remain in effect (meaning this test can be used) for the duration of  the COVID-19 declaration under Section 564(b)(1) of the Act, 21 U.S.C. section 360bbb-3(b)(1), unless the a uthorization is terminated or revoked sooner.     DG Chest Portable 1 View  Result Date: 03/31/2019 CLINICAL DATA:  Cough. Fever and chills. COVID positive. Thirty-six weeks pregnant. EXAM: PORTABLE CHEST 1 VIEW COMPARISON:  None. FINDINGS: The  cardiomediastinal contours are normal. Subsegmental opacity at the right lung base. Pulmonary vasculature is normal. No confluent consolidation, pleural effusion, or pneumothorax. No acute osseous abnormalities are seen. IMPRESSION: Subsegmental opacity at the right lung base, typically atelectasis, may represent pneumonia in the setting of COVID infection. Electronically Signed   By: Keith Rake M.D.   On: 03/31/2019 01:15    Assessment/Plan: 44 yo G6P5005 with covid symptoms at 27w4dand preterm contractions - Patient stable on room air. Will defer covid management to medical team - Patient  to receive betamethasone given frequency of contractions despite no cervical change - Tocolysis with procardia pending second dose of betamethasone - NST twice daily - Will continue to follow closely  Gavina Dildine 03/31/2019

## 2019-03-31 NOTE — Consult Note (Signed)
Medical Consultation   Leah Fernandez  JJK:[id number]  DOB: Aug 03, 1975  DOA: 03/30/2019  PCP: Osborne Oman, MD   Requesting physician: Domenica Reamer  Reason for consultation: Needs a consult - stable on room air.  Came in with COVID symptoms, concerned about PNA based on CXR opacity.  Has cough, increased WOB.  She is 36+[redacted] weeks gestation at this time.  History of Present Illness: Leah Fernandez is an 44 y.o. female without significant medical history who is 36+4 weeks' gestation and with contractions, + COVID last last night.  She has been treated with Betamethasone and Procardia.  The patient reports that she has 5 other children.  She started having contractions yesterday and came in for evaluation.  No cough, SOB.  She had subjective fever earlier this week.  Overall, she feels well at this time.      Review of Systems:  ROS As per HPI otherwise 10 point review of systems negative.    Past Medical History: Past Medical History:  Diagnosis Date  . Asthma   . Cervix dysplasia     Past Surgical History: Past Surgical History:  Procedure Laterality Date  . LEEP       Allergies:  No Known Allergies   Social History:  reports that she has never smoked. She has never used smokeless tobacco. She reports that she does not drink alcohol or use drugs.   Family History: Family History  Problem Relation Age of Onset  . Diabetes Mother   . Hypertension Sister   . Cancer Sister       Physical Exam: Vitals:   03/31/19 1352 03/31/19 1500 03/31/19 1653 03/31/19 1656  BP: 104/62   96/60  Pulse: 87   80  Resp: (!) 21 (!) 21  20  Temp: (!) 97.2 F (36.2 C)   (!) 97.2 F (36.2 C)  TempSrc: Oral   Oral  SpO2:  99%  100%  Height:   5\' 1"  (1.549 m)     Constitutional: Alert and awake, oriented x3, not in any acute distress. Eyes:  EOMI, irises appear normal, anicteric sclera ENMT: external ears and nose appear normal, normal hearing, Lips appear  normal Neck: neck appears normal, no masses, normal ROM CVS: S1-S2 clear, no murmur rubs or gallops, no LE edema, normal pedal pulses  Respiratory:  clear to auscultation bilaterally, no wheezing, rales or rhonchi. Respiratory effort normal. No accessory muscle use.  Abdomen: gravid, NT Musculoskeletal: : no cyanosis, clubbing or edema noted bilaterally Neuro: Cranial nerves II-XII intact, strength, sensation, reflexes Psych: judgement and insight appear normal, stable mood and affect, mental status Skin: no rashes or lesions or ulcers, no induration or nodules    Data reviewed:  I have personally reviewed the recent labs and imaging studies  Pertinent Labs:   K+ 3.3 CO2 17 Glucose 125 Albumin 2.6 LDH 108 Ferritin 9 CRP 2.0 WBC 8.6 Hgb 10.6 Platelets 322 D-dimer 0.94 Procalcitonin <0.10 BD COVID positive    Inpatient Medications:   Scheduled Meds: . aspirin EC  81 mg Oral Daily  . betamethasone acetate-betamethasone sodium phosphate  12 mg Intramuscular Q24 Hr x 2  . heparin  5,000 Units Subcutaneous Q8H  . NIFEdipine  30 mg Oral BID   Continuous Infusions: . sodium chloride 100 mL/hr at 03/31/19 1648     Radiological Exams on Admission: DG Chest Port 1 View  Result Date: 03/31/2019 CLINICAL  DATA:  COVID-19 positivity EXAM: PORTABLE CHEST 1 VIEW COMPARISON:  03/31/2019 FINDINGS: Cardiac shadow is within normal limits. The lungs are well aerated bilaterally. No focal infiltrate is seen. No bony abnormality is noted. IMPRESSION: No acute abnormality noted. Electronically Signed   By: Alcide Clever M.D.   On: 03/31/2019 15:08   DG Chest Portable 1 View  Result Date: 03/31/2019 CLINICAL DATA:  Cough. Fever and chills. COVID positive. Thirty-six weeks pregnant. EXAM: PORTABLE CHEST 1 VIEW COMPARISON:  None. FINDINGS: The cardiomediastinal contours are normal. Subsegmental opacity at the right lung base. Pulmonary vasculature is normal. No confluent consolidation, pleural  effusion, or pneumothorax. No acute osseous abnormalities are seen. IMPRESSION: Subsegmental opacity at the right lung base, typically atelectasis, may represent pneumonia in the setting of COVID infection. Electronically Signed   By: Narda Rutherford M.D.   On: 03/31/2019 01:15    Impression/Recommendations Principal Problem:   COVID-19 affecting pregnancy in third trimester  -Patient presented with standard COVID-19 symptoms and was + for COVID-19 infection -Initial CXR with ?infiltrate -At the time of my evaluation, the patient did not appear to have respiratory symptoms -Labs are reassuring that her inflammatory response is mild -Repeat CXR is negative for infiltrate -I do not see an indication for treatment of COVID-19 infection at this time -Continue supportive management -Further pregnancy management per OB -Patient appears stable for d/c to home for home quarantine from a COVID perspective   Thank you for this consultation.  Our [facility] hospitalist team will sign off at this time.  Please reconsult if the patient develops recurrent symptoms concerning for COVID-19 respiratory disease.   Time Spent: 45 minutes  Jonah Blue M.D. [facility] 03/31/2019, 6:42 PM

## 2019-03-31 NOTE — Progress Notes (Signed)
Tele applied to patient and RN called at [facility] to have patient admitted, however, RN still at the bedside and NT will call from the desk to have patient admitted.

## 2019-03-31 NOTE — ED Notes (Signed)
Lunch Tray Ordered @ 1033. 

## 2019-03-31 NOTE — Progress Notes (Addendum)
1200-Brittany NT called for assistance in admitting patient into the tele computer.

## 2019-03-31 NOTE — ED Notes (Signed)
Breakfast ordered 

## 2019-03-31 NOTE — ED Notes (Signed)
Temp down  infused

## 2019-03-31 NOTE — ED Notes (Signed)
Rapid ob response rn at bedside

## 2019-04-01 LAB — CBC WITH DIFFERENTIAL/PLATELET
Abs Immature Granulocytes: 0.05 10*3/uL (ref 0.00–0.07)
Basophils Absolute: 0 10*3/uL (ref 0.0–0.1)
Basophils Relative: 0 %
Eosinophils Absolute: 0 10*3/uL (ref 0.0–0.5)
Eosinophils Relative: 0 %
HCT: 32.2 % — ABNORMAL LOW (ref 36.0–46.0)
Hemoglobin: 10.5 g/dL — ABNORMAL LOW (ref 12.0–15.0)
Immature Granulocytes: 1 %
Lymphocytes Relative: 19 %
Lymphs Abs: 1.5 10*3/uL (ref 0.7–4.0)
MCH: 27.3 pg (ref 26.0–34.0)
MCHC: 32.6 g/dL (ref 30.0–36.0)
MCV: 83.6 fL (ref 80.0–100.0)
Monocytes Absolute: 0.3 10*3/uL (ref 0.1–1.0)
Monocytes Relative: 3 %
Neutro Abs: 6.1 10*3/uL (ref 1.7–7.7)
Neutrophils Relative %: 77 %
Platelets: 357 10*3/uL (ref 150–400)
RBC: 3.85 MIL/uL — ABNORMAL LOW (ref 3.87–5.11)
RDW: 14.3 % (ref 11.5–15.5)
WBC: 7.8 10*3/uL (ref 4.0–10.5)
nRBC: 0 % (ref 0.0–0.2)

## 2019-04-01 LAB — CULTURE, BETA STREP (GROUP B ONLY): Strep Gp B Culture: NEGATIVE

## 2019-04-01 LAB — FERRITIN: Ferritin: 15 ng/mL (ref 11–307)

## 2019-04-01 LAB — C-REACTIVE PROTEIN: CRP: 1.3 mg/dL — ABNORMAL HIGH (ref <1.0)

## 2019-04-01 LAB — CULTURE, OB URINE

## 2019-04-01 LAB — D-DIMER, QUANTITATIVE: D-Dimer, Quant: 0.35 ug/mL-FEU (ref 0.00–0.50)

## 2019-04-01 MED ORDER — CYCLOBENZAPRINE HCL 10 MG PO TABS: 10.0000 mg | ORAL_TABLET | Freq: Three times a day (TID) | ORAL | 0 refills | Status: DC | PRN

## 2019-04-01 NOTE — Discharge Instructions (Signed)
COVID-19: Cmo protegerse y proteger a los dems COVID-19: How to Protect Yourself and Others Sepa cmo se propaga  Actualmente, no existe ninguna vacuna para prevenir la enfermedad por coronavirus 2019 (COVID-19).  La mejor forma de prevenir la enfermedad es evitar exponerse a este virus.  Se cree que el virus se transmite principalmente de una persona a otra. ? Entre las personas que estn en contacto directo entre s (a una distancia inferior a 6 pies [1.80m]). ? A travs de las gotitas respiratorias producidas cuando una persona infectada tose, estornuda o habla. ? Estas gotitas pueden caer en la boca o en la nariz de las personas que estn cerca o pueden ser inhaladas hacia los pulmones. ? El COVID-19 puede ser transmitido por personas que no presentan sntomas. Lo que todos deben hacer Lmpiese las manos con frecuencia  Lvese las manos con frecuencia con agua y jabn durante al menos 20 segundos, especialmente despus de haber estado en un lugar pblico o despus de sonarse la nariz, toser o estornudar.  Si no dispone de agua y jabn, use un desinfectante de manos que contenga al menos un 60% de alcohol. Cubra todas las superficies de las manos y frtelas hasta que se sientan secas.  No se toque los ojos, la nariz y la boca sin antes lavarse las manos. Evite el contacto cercano  Limite el contacto directo con otras personas tanto como sea posible.  Evite el contacto cercano con personas que estn enfermas.  Establezca distancia entre usted y otras personas. ? Recuerde que algunas personas que no tienen sntomas pueden transmitir el virus. ? Esto es especialmente importante para las personas que tienen ms riesgo de enfermarse.www.cdc.gov/coronavirus/2019-ncov/need-extra-precautions/people-at-higher-risk.html Cbrase la boca y la nariz con una mascarilla cuando est cerca de otras personas  Puede transmitir el COVID-19 a otras personas aunque no se sienta enfermo.  Todas las  personas deben usar mascarilla en lugares pblicos y cuando estn con otras que no vivan en su casa, especialmente cuando el distanciamiento social sea difcil de mantener. ? Las mascarillas no deben colocarse a nios menores de 2 aos de edad, a las personas que tienen problemas respiratorios o que estn inconscientes, incapacitadas o que por algn motivo no puedan quitarse la mascarilla sin ayuda.  El propsito de la mascarilla es proteger a otras personas en caso de que usted est infectado.  NO utilice las mascarillas destinadas a los trabajadores de la salud.  Contine manteniendo una distancia aproximada de 6 pies (1.80m) entre usted y otras personas. La mascarilla no reemplaza el distanciamiento social. Cbrase al toser y estornudar  Al toser o al estornudar, siempre cbrase la boca y la nariz con un pauelo descartable o use el interior del codo.  Deseche los pauelos descartables usados en la basura.  Inmediatamente, lvese las manos con agua y jabn durante al menos 20segundos. Si no dispone de agua y jabn, lmpiese las manos con un desinfectante de manos que contenga al menos un 60% de alcohol. Limpie y desinfecte  Limpie Y desinfecte las superficies que se tocan con frecuencia todos los das. Esto incluye mesas, picaportes, interruptores de luz, encimeras, mangos, escritorios, telfonos, teclados, inodoros, grifos y lavabos. www.cdc.gov/coronavirus/2019-ncov/prevent-getting-sick/disinfecting-your-home.html  Si las superficies estn sucias, lmpielas: Use detergente o jabn y agua antes de la desinfeccin.  Luego, use un desinfectante domstico. Puede consultar una lista de los desinfectantes domsticos registrados en la Environmental Protection Agency (EPA) (Agencia de Proteccin Ambiental) aqu. cdc.gov/coronavirus 11/10/2018 Esta informacin no tiene como fin reemplazar el consejo del mdico.   Asegrese de hacerle al mdico cualquier pregunta que tenga. Document Revised:  11/24/2018 Document Reviewed: 09/17/2018 Elsevier Patient Education  2020 [employer].   COVID-19 COVID-19 El COVID-19 es una infeccin respiratoria causada por un virus llamado coronavirus tipo 2 causante del sndrome respiratorio agudo grave (SARS-CoV-2). La enfermedad tambin se conoce como enfermedad por coronavirus o nuevo coronavirus. En algunas personas, el virus puede no ocasionar sntomas. En otras, puede producir una infeccin grave. La infeccin puede empeorar rpidamente y causar complicaciones, como:  Neumona o infeccin en los pulmones.  Sndrome de dificultad respiratoria aguda o SDRA. Es una afeccin que se caracteriza por la acumulacin de lquido en los pulmones, que impide que los pulmones se llenen de aire y pasen oxgeno a la sangre.  Insuficiencia respiratoria aguda. Es una afeccin que se caracteriza porque no pasa suficiente oxgeno de los pulmones al cuerpo o porque el dixido de carbono no pasa de los pulmones hacia afuera del cuerpo.  Sepsis o choque sptico. Se trata de una reaccin grave del cuerpo ante una infeccin.  Problemas de coagulacin.  Infecciones secundarias debido a bacterias u hongos.  Falla de rganos. Ocurre cuando los rganos del cuerpo dejan de funcionar. El virus que causa el COVID-19 es contagioso. Esto significa que puede transmitirse de una persona a otra a travs de las gotitas de saliva de la tos y de los estornudos (secreciones respiratorias). Cules son las causas? Esta enfermedad es causada por un virus. Usted puede contagiarse con este virus:  Al inspirar las gotitas de una persona infectada. Las gotitas pueden diseminarse cuando una persona respira, habla, canta, tose o estornuda.  Al tocar algo, como una mesa o el picaportes de una puerta, que estuvo expuesto al virus (contaminado) y luego tocarse la boca, nariz o los ojos. Qu incrementa el riesgo? Riesgo de infeccin Es ms probable que se infecte con este virus si:  Se  encuentra a una distancia menor a 6 pies (2 metros) de una persona con COVID-19.  Cuida o vive con una persona infectada con COVID-19.  Pasa tiempo en espacios interiores repletos de gente o vive en viviendas compartidas. Riesgo de enfermedad grave Es ms probable que se enferme gravemente por el virus si:  Tiene 50aos o ms. Cuanto mayor sea su edad, mayor ser el riesgo de tener una enfermedad grave.  Vive en un hogar de ancianos o centro de atencin a largo plazo.  Tiene cncer.  Tiene una enfermedad prolongada (crnica), como las siguientes: ? Enfermedad pulmonar crnica, que incluye la enfermedad pulmonar obstructiva crnica o asma. ? Una enfermedad crnica que disminuye la capacidad del cuerpo para combatir las infecciones (immunocomprometido). ? Enfermedad cardaca, que incluye insuficiencia cardaca, una afeccin que se caracteriza porque las arterias que llegan al corazn se estrechan u obstruyen (arteriopata coronaria) o una enfermedad que provoca que el msculo cardaco se engrose, se debilite o endurezca (miocardiopata). ? Diabetes. ? Enfermedad renal crnica. ? Anemia drepanoctica, una enfermedad que se caracteriza porque los glbulos rojos tienen una forma anormal de "hoz". ? Enfermedad heptica.  Es obeso. Cules son los signos o sntomas? Los sntomas de esta afeccin pueden ser de leves a graves. Los sntomas pueden aparecer en el trmino de 2 a 14 das despus de haber estado expuesto al virus. Incluyen los siguientes:  Fiebre o escalofros.  Tos.  Dificultad para respirar.  Dolores de cabeza, dolores en el cuerpo o dolores musculares.  Secrecin o congestin nasal.  Dolor de garganta.  Nueva prdida del sentido del gusto   o del olfato. Algunas personas tambin pueden tener problemas estomacales, como nuseas, vmitos o diarrea. Es posible que otras personas no tengan sntomas de COVID-19. Cmo se diagnostica? Esta afeccin se puede diagnosticar en  funcin de lo siguiente:  Sus signos y sntomas, especialmente si: ? Vive en una zona donde hay un brote de COVID-19. ? Viaj recientemente a una zona donde el virus es frecuente. ? Cuida o vive con una persona a quien se le diagnostic COVID-19. ? Estuvo expuesto a una persona a la que se le diagnostic COVID-19.  Un examen fsico.  Anlisis de laboratorio que pueden incluir: ? Tomar una muestra de lquido de la parte posterior de la nariz y la garganta (lquido nasofarngeo), la nariz o la garganta, con un hisopo. ? Una muestra de mucosidad de los pulmones (esputo). ? Anlisis de sangre.  Los estudios de diagnstico por imgenes pueden incluir radiografas, exploracin por tomografa computarizada (TC) o ecografa. Cmo se trata? En este momento, no hay ningn medicamento para tratar el COVID-19. Los medicamentos para tratar otras enfermedades se usan a modo de ensayo para comprobar si son eficaces contra el COVID-19. El mdico le informar sobre las maneras de tratar los sntomas. En la mayora de las personas, la infeccin es leve y puede controlarse en el hogar con reposo, lquidos y medicamentos de venta libre. El tratamiento para una infeccin grave suele realizarse en la unidad de cuidados intensivos (UCI) de un hospital. Puede incluir uno o ms de los siguientes. Estos tratamientos se administran hasta que los sntomas mejoran.  Recibir lquidos y medicamentos a travs de una va intravenosa.  Oxgeno complementario. Para administrar oxgeno extra, se utiliza un tubo en la nariz, una mascarilla o una campana de oxgeno.  Colocarlo para que se recueste boca abajo (decbito prono). Esto facilita el ingreso de oxgeno a los pulmones.  Uso continuo de una mquina de presin positiva de las vas areas (CPAP) o de presin positiva de las vas areas de dos niveles (BPAP). Este tratamiento utiliza una presin de aire leve para mantener las vas respiratorias abiertas. Un tubo conectado  a un motor administra oxgeno al cuerpo.  Respirador. Este tratamiento mueve el aire dentro y fuera de los pulmones mediante el uso de un tubo que se coloca en la trquea.  Traqueostoma. En este procedimiento se hace un orificio en el cuello para insertar un tubo de respiracin.  Oxigenacin por membrana extracorprea (OMEC). En este procedimiento, los pulmones tienen la posibilidad de recuperarse al asumir las funciones del corazn y los pulmones. Suministra oxgeno al cuerpo y elimina el dixido de carbono. Siga estas instrucciones en su casa: Estilo de vida  Si est enfermo, qudese en su casa, excepto para obtener atencin mdica. El mdico le indicar cunto tiempo debe quedarse en casa. Llame al mdico antes de buscar atencin mdica.  Haga reposo en su casa como se lo haya indicado el mdico.  No consuma ningn producto que contenga nicotina o tabaco, como cigarrillos, cigarrillos electrnicos y tabaco de mascar. Si necesita ayuda para dejar de fumar, consulte al mdico.  Retome sus actividades normales segn lo indicado por el mdico. Pregntele al mdico qu actividades son seguras para usted. Instrucciones generales  Use los medicamentos de venta libre y los recetados solamente como se lo haya indicado el mdico.  Beba suficiente lquido como para mantener la orina de color amarillo plido.  Concurra a todas las visitas de seguimiento como se lo haya indicado el mdico. Esto es importante. Cmo se evita?    No hay ninguna vacuna que ayude a prevenir la infeccin por COVID-19. Sin embargo, hay medidas que puede tomar para protegerse y proteger a otras personas de este virus. Para protegerse:   No viaje a zonas donde el COVID-19 sea un riesgo. Las zonas donde se informa la presencia del COVID-19 cambian con frecuencia. Para identificar las zonas de alto riesgo y las restricciones de viaje, consulte el sitio web de viajes de los Centers for Disease Control and Prevention (CDC)  (Centros para el Control y la Prevencin de Enfermedades): wwwnc.cdc.gov/travel/notices  Si vive o debe viajar a una zona donde el COVID-19 es un riesgo, tome precauciones para evitar infecciones. ? Aljese de las personas enfermas. ? Lvese las manos frecuentemente con agua y jabn durante 20segundos. Use desinfectante para manos con alcohol si no dispone de agua y jabn. ? Evite tocarse la boca, la cara, los ojos o la nariz. ? Evite salir de su casa, siga las indicaciones de su estado y de las autoridades sanitarias locales. ? Si debe salir de su casa, use un barbijo de tela o una mascarilla facial. Asegrese de que le cubra la nariz y la boca. ? Evite los espacios interiores repletos de gente. Mantenga una distancia de al menos 6 pies (2 metros) de otras personas. ? Desinfecte los objetos y las superficies que se tocan con frecuencia todos los das. Pueden incluir:  Encimeras y mesas.  Picaportes e interruptores de luz.  Lavabos, fregaderos y grifos.  Aparatos electrnicos tales como telfonos, controles remotos, teclados, computadoras y tabletas. Cmo proteger a los dems: Si tiene sntomas de COVID-19, tome medidas para evitar que el virus se propague a otras personas.  Si cree que tiene una infeccin por COVID-19, comunquese de inmediato con su mdico. Informe al equipo de atencin mdica que cree que puede tener una infeccin por el COVID-19.  Qudese en su casa. Salga de su casa solo para buscar atencin mdica. No utilice el transporte pblico.  No viaje mientras est enfermo.  Lvese las manos frecuentemente con agua y jabn durante 20segundos. Usar desinfectante para manos con alcohol si no dispone de agua y jabn.  Mantngase alejado de quienes vivan con usted. Permita que los miembros de la familia sanos cuiden a los nios y las mascotas, si es posible. Si tiene que cuidar a los nios o las mascotas, lvese las manos con frecuencia y use un barbijo. Si es posible,  permanezca en su habitacin, separado de los dems. Utilice un bao diferente.  Asegrese de que todas las personas que viven en su casa se laven bien las manos y con frecuencia.  Tosa o estornude en un pauelo de papel o sobre su manga o codo. No tosa o estornude al aire ni se cubra la boca o la nariz con la mano.  Use un barbijo de tela o una mascarilla facial. Asegrese de que le cubra la nariz y la boca. Dnde buscar ms informacin  Centers for Disease Control and Prevention (Centros para el Control y la Prevencin de Enfermedades): www.cdc.gov/coronavirus/2019-ncov/index.html  World Health Organization (Organizacin Mundial de la Salud): www.who.int/health-topics/coronavirus Comunquese con un mdico si:  Vive o ha viajado a una zona donde el COVID-19 es un riesgo y tiene sntomas de infeccin.  Ha tenido contacto con alguien que tiene COVID-19 y usted tiene sntomas de infeccin. Solicite ayuda inmediatamente si:  Tiene dificultad para respirar.  Siente dolor u opresin en el pecho.  Experimenta confusin.  Tiene las uas de los dedos y los labios de   color azulado.  Tiene dificultad para despertarse.  Los sntomas empeoran. Estos sntomas pueden representar un problema grave que constituye una emergencia. No espere a ver si los sntomas desaparecen. Solicite atencin mdica de inmediato. Comunquese con el servicio de emergencias de su localidad (911 en los [place]). No conduzca por sus propios medios hasta el hospital. Informe al personal mdico de emergencias si cree que tiene COVID-19. Resumen  El COVID-19 es una infeccin respiratoria causada por un virus. Tambin se conoce como enfermedad por coronavirus o nuevo coronavirus. Puede causar infecciones graves, como neumona, sndrome de dificultad respiratoria aguda, insuficiencia respiratoria aguda o sepsis.  El virus que causa el COVID-19 es contagioso. Esto significa que puede transmitirse de una persona a otra a  travs de las gotitas que se despiden al respirar, hablar, cantar, toser y estornudar.  Es ms probable que desarrolle una enfermedad grave si tiene 50 aos o ms, tiene el sistema inmunitario dbil, vive en un hogar de ancianos o tiene una enfermedad crnica.  No hay ningn medicamento para tratar el COVID-19. El mdico le informar sobre las maneras de tratar los sntomas.  Tome medidas para protegerse y proteger a los dems contra las infecciones. Lvese las manos con frecuencia y desinfecte los objetos y las superficies que se tocan con frecuencia todos los das. Mantngase alejado de las personas que estn enfermas y use un barbijo si est enfermo. Esta informacin no tiene como fin reemplazar el consejo del mdico. Asegrese de hacerle al mdico cualquier pregunta que tenga. Document Revised: 12/30/2018 Document Reviewed: 04/24/2018 Elsevier Patient Education  2020 [employer].  

## 2019-04-01 NOTE — Discharge Summary (Signed)
Antenatal Physician Discharge Summary  Patient ID: Leah Fernandez MRN: [id number] DOB/AGE: 09/25/1975 44 y.o.  Admit date: 03/30/2019 Discharge date: 04/01/2019  Admission Diagnoses: Principal Problem:   COVID-19 affecting pregnancy in third trimester Active Problems:   AMA (advanced maternal age) multigravida 35+   Supervision of high risk pregnancy, antepartum    Discharge Diagnoses: Same  Prenatal Procedures: none  Consults: [facility]  Hospital Course:  This is a 44 y.o. [id number] with IUP at 48w5dadmitted for Covid + with contractions. Given Procardia. Contractions stopped. She received BMZ x 2. No abnormality on CXR, normal respiratory rate. No O2 needs. Vitals and labs are stable. Mild inflammatory response. Ok for D/C  Discharge Exam: Temp:  [97.2 F (36.2 C)-97.7 F (36.5 C)] 97.7 F (36.5 C) (01/22 0954) Pulse Rate:  [69-87] 78 (01/22 0954) Resp:  [16-21] 18 (01/22 0954) BP: (91-104)/(45-62) 97/60 (01/22 0954) SpO2:  [99 %-100 %] 100 % (01/22 0704) Physical Examination: CONSTITUTIONAL: Well-developed, well-nourished female in no acute distress.  HENT:  Normocephalic, atraumatic, External right and left ear normal. Oropharynx is clear and moist EYES: Conjunctivae and EOM are normal. Pupils are equal, round, and reactive to light. No scleral icterus.  NECK: Normal range of motion, supple, no masses SKIN: Skin is warm and dry. No rash noted. Not diaphoretic. No erythema. No pallor. [place] Alert and oriented to person, place, and time. Normal reflexes, muscle tone coordination. No cranial nerve deficit noted. PSYCHIATRIC: Normal mood and affect. Normal behavior. Normal judgment and thought content. CARDIOVASCULAR: Normal heart rate noted, regular rhythm RESPIRATORY: Effort and breath sounds normal, no problems with respiration noted MUSCULOSKELETAL: Normal range of motion. No edema and no tenderness. 2+ distal pulses. ABDOMEN: Soft, nontender, nondistended,  gravid. CERVIX: Dilation: 1 Effacement (%): 50 Cervical Position: Posterior Station: -3 Presentation: Undeterminable Exam by:: EDarliss RidgelRN  Fetal monitoring: FHR: 130 bpm, Variability: moderate, Accelerations: Present, Decelerations: Absent  Uterine activity: few contractions per hour  Significant Diagnostic Studies:  Results for orders placed or performed during the hospital encounter of 03/30/19 (from the past 168 hour(s))  CBC with Differential   Collection Time: 03/31/19 12:13 AM  Result Value Ref Range   WBC 7.9 4.0 - 10.5 K/uL   RBC 4.11 3.87 - 5.11 MIL/uL   Hemoglobin 11.2 (L) 12.0 - 15.0 g/dL   HCT 33.2 (L) 36.0 - 46.0 %   MCV 80.8 80.0 - 100.0 fL   MCH 27.3 26.0 - 34.0 pg   MCHC 33.7 30.0 - 36.0 g/dL   RDW 13.7 11.5 - 15.5 %   Platelets 346 150 - 400 K/uL   nRBC 0.0 0.0 - 0.2 %   Neutrophils Relative % 70 %   Neutro Abs 5.6 1.7 - 7.7 K/uL   Lymphocytes Relative 21 %   Lymphs Abs 1.6 0.7 - 4.0 K/uL   Monocytes Relative 8 %   Monocytes Absolute 0.6 0.1 - 1.0 K/uL   Eosinophils Relative 0 %   Eosinophils Absolute 0.0 0.0 - 0.5 K/uL   Basophils Relative 1 %   Basophils Absolute 0.1 0.0 - 0.1 K/uL   Immature Granulocytes 0 %   Abs Immature Granulocytes 0.03 0.00 - 0.07 K/uL  Comprehensive metabolic panel   Collection Time: 03/31/19 12:13 AM  Result Value Ref Range   Sodium 133 (L) 135 - 145 mmol/L   Potassium 3.3 (L) 3.5 - 5.1 mmol/L   Chloride 107 98 - 111 mmol/L   CO2 17 (L) 22 - 32 mmol/L   Glucose,  Bld 107 (H) 70 - 99 mg/dL   BUN 6 6 - 20 mg/dL   Creatinine, Ser 0.57 0.44 - 1.00 mg/dL   Calcium 8.3 (L) 8.9 - 10.3 mg/dL   Total Protein 6.1 (L) 6.5 - 8.1 g/dL   Albumin 2.6 (L) 3.5 - 5.0 g/dL   AST 21 15 - 41 U/L   ALT 13 0 - 44 U/L   Alkaline Phosphatase 113 38 - 126 U/L   Total Bilirubin 0.5 0.3 - 1.2 mg/dL   GFR calc non Af Amer >60 >60 mL/min   GFR calc Af Amer >60 >60 mL/min   Anion gap 9 5 - 15  Urinalysis, Routine w reflex microscopic    Collection Time: 03/31/19 12:25 AM  Result Value Ref Range   Color, Urine YELLOW YELLOW   APPearance CLEAR CLEAR   Specific Gravity, Urine 1.012 1.005 - 1.030   pH 6.0 5.0 - 8.0   Glucose, UA NEGATIVE NEGATIVE mg/dL   Hgb urine dipstick NEGATIVE NEGATIVE   Bilirubin Urine NEGATIVE NEGATIVE   Ketones, ur NEGATIVE NEGATIVE mg/dL   Protein, ur NEGATIVE NEGATIVE mg/dL   Nitrite NEGATIVE NEGATIVE   Leukocytes,Ua TRACE (A) NEGATIVE   RBC / HPF 0-5 0 - 5 RBC/hpf   WBC, UA 0-5 0 - 5 WBC/hpf   Bacteria, UA RARE (A) NONE SEEN   Squamous Epithelial / LPF 0-5 0 - 5   Mucus PRESENT   POC SARS Coronavirus 2 Ag-ED - Nasal Swab (BD Veritor Kit)   Collection Time: 03/31/19 12:53 AM  Result Value Ref Range   SARS Coronavirus 2 Ag POSITIVE (A) NEGATIVE  ABO/Rh   Collection Time: 03/31/19  4:22 AM  Result Value Ref Range   ABO/RH(D) A POS    No rh immune globuloin      NOT A RH IMMUNE GLOBULIN CANDIDATE, PT RH POSITIVE Performed at [facility], [address] [place], [place] [address]   OB Urine Culture   Collection Time: 03/31/19  6:20 AM   Specimen: Urine, Random  Result Value Ref Range   Specimen Description URINE, RANDOM    Special Requests NONE    Culture (A)     MULTIPLE SPECIES PRESENT, SUGGEST RECOLLECTION NO GROUP B STREP (S.AGALACTIAE) ISOLATED Performed at [facility] [address] [place], [place] [address]    Report Status 04/01/2019 FINAL   D-dimer, quantitative (not at [facility])   Collection Time: 03/31/19  6:21 AM  Result Value Ref Range   D-Dimer, Quant 0.94 (H) 0.00 - 0.50 ug/mL-FEU  C-reactive protein   Collection Time: 03/31/19  6:21 AM  Result Value Ref Range   CRP 2.0 (H) <1.0 mg/dL  Ferritin   Collection Time: 03/31/19  6:21 AM  Result Value Ref Range   Ferritin 9 (L) 11 - 307 ng/mL  Lactate dehydrogenase   Collection Time: 03/31/19  6:21 AM  Result Value Ref Range   LDH 108 98 - 192 U/L  CBC   Collection Time: 03/31/19  6:21 AM  Result  Value Ref Range   WBC 8.6 4.0 - 10.5 K/uL   RBC 3.94 3.87 - 5.11 MIL/uL   Hemoglobin 10.6 (L) 12.0 - 15.0 g/dL   HCT 32.8 (L) 36.0 - 46.0 %   MCV 83.2 80.0 - 100.0 fL   MCH 26.9 26.0 - 34.0 pg   MCHC 32.3 30.0 - 36.0 g/dL   RDW 13.8 11.5 - 15.5 %   Platelets 322 150 - 400 K/uL  nRBC 0.0 0.0 - 0.2 %  Comprehensive metabolic panel   Collection Time: 03/31/19  6:21 AM  Result Value Ref Range   Sodium 135 135 - 145 mmol/L   Potassium 3.3 (L) 3.5 - 5.1 mmol/L   Chloride 109 98 - 111 mmol/L   CO2 17 (L) 22 - 32 mmol/L   Glucose, Bld 125 (H) 70 - 99 mg/dL   BUN 5 (L) 6 - 20 mg/dL   Creatinine, Ser 0.51 0.44 - 1.00 mg/dL   Calcium 8.3 (L) 8.9 - 10.3 mg/dL   Total Protein 5.9 (L) 6.5 - 8.1 g/dL   Albumin 2.6 (L) 3.5 - 5.0 g/dL   AST 18 15 - 41 U/L   ALT 14 0 - 44 U/L   Alkaline Phosphatase 106 38 - 126 U/L   Total Bilirubin 0.5 0.3 - 1.2 mg/dL   GFR calc non Af Amer >60 >60 mL/min   GFR calc Af Amer >60 >60 mL/min   Anion gap 9 5 - 15  Procalcitonin - Baseline   Collection Time: 03/31/19  6:21 AM  Result Value Ref Range   Procalcitonin <0.10 ng/mL  I-stat chem 8, ED (not at [facility] or [facility])   Collection Time: 03/31/19  6:33 AM  Result Value Ref Range   Sodium 137 135 - 145 mmol/L   Potassium 3.4 (L) 3.5 - 5.1 mmol/L   Chloride 106 98 - 111 mmol/L   BUN 4 (L) 6 - 20 mg/dL   Creatinine, Ser 0.40 (L) 0.44 - 1.00 mg/dL   Glucose, Bld 120 (H) 70 - 99 mg/dL   Calcium, Ion 1.14 (L) 1.15 - 1.40 mmol/L   TCO2 18 (L) 22 - 32 mmol/L   Hemoglobin 10.9 (L) 12.0 - 15.0 g/dL   HCT 32.0 (L) 36.0 - 46.0 %  D-dimer, quantitative (not at [facility])   Collection Time: 04/01/19  6:45 AM  Result Value Ref Range   D-Dimer, Quant 0.35 0.00 - 0.50 ug/mL-FEU  C-reactive protein   Collection Time: 04/01/19  6:45 AM  Result Value Ref Range   CRP 1.3 (H) <1.0 mg/dL  Ferritin   Collection Time: 04/01/19  6:45 AM  Result Value Ref Range   Ferritin 15 11 - 307 ng/mL  CBC with Differential/Platelet    Collection Time: 04/01/19  6:45 AM  Result Value Ref Range   WBC 7.8 4.0 - 10.5 K/uL   RBC 3.85 (L) 3.87 - 5.11 MIL/uL   Hemoglobin 10.5 (L) 12.0 - 15.0 g/dL   HCT 32.2 (L) 36.0 - 46.0 %   MCV 83.6 80.0 - 100.0 fL   MCH 27.3 26.0 - 34.0 pg   MCHC 32.6 30.0 - 36.0 g/dL   RDW 14.3 11.5 - 15.5 %   Platelets 357 150 - 400 K/uL   nRBC 0.0 0.0 - 0.2 %   Neutrophils Relative % 77 %   Neutro Abs 6.1 1.7 - 7.7 K/uL   Lymphocytes Relative 19 %   Lymphs Abs 1.5 0.7 - 4.0 K/uL   Monocytes Relative 3 %   Monocytes Absolute 0.3 0.1 - 1.0 K/uL   Eosinophils Relative 0 %   Eosinophils Absolute 0.0 0.0 - 0.5 K/uL   Basophils Relative 0 %   Basophils Absolute 0.0 0.0 - 0.1 K/uL   Immature Granulocytes 1 %   Abs Immature Granulocytes 0.05 0.00 - 0.07 K/uL  Results for orders placed or performed in visit on 03/28/19 (from the past 168 hour(s))  GC/Chlamydia probe amp (Victoria Vera)not at  [facility]   Collection Time: 03/28/19  9:16 AM  Result Value Ref Range   Neisseria Gonorrhea Negative    Chlamydia Negative    Comment Normal Reference Ranger Chlamydia - Negative    Comment      Normal Reference Range Neisseria Gonorrhea - Negative  Culture, beta strep (group b only)   Collection Time: 03/28/19  4:19 PM   Specimen: Vaginal/Rectal; Genital   VR  Result Value Ref Range   Strep Gp B Culture Negative Negative   DG Chest Port 1 View  Result Date: 03/31/2019 CLINICAL DATA:  COVID-19 positivity EXAM: PORTABLE CHEST 1 VIEW COMPARISON:  03/31/2019 FINDINGS: Cardiac shadow is within normal limits. The lungs are well aerated bilaterally. No focal infiltrate is seen. No bony abnormality is noted. IMPRESSION: No acute abnormality noted. Electronically Signed   By: Inez Catalina M.D.   On: 03/31/2019 15:08   DG Chest Portable 1 View  Result Date: 03/31/2019 CLINICAL DATA:  Cough. Fever and chills. COVID positive. Thirty-six weeks pregnant. EXAM: PORTABLE CHEST 1 VIEW COMPARISON:  None. FINDINGS: The  cardiomediastinal contours are normal. Subsegmental opacity at the right lung base. Pulmonary vasculature is normal. No confluent consolidation, pleural effusion, or pneumothorax. No acute osseous abnormalities are seen. IMPRESSION: Subsegmental opacity at the right lung base, typically atelectasis, may represent pneumonia in the setting of COVID infection. Electronically Signed   By: Keith Rake M.D.   On: 03/31/2019 01:15   US FETAL BPP W/NONSTRESS  Result Date: 03/30/2019 ----------------------------------------------------------------------  OBSTETRICS REPORT                       (Signed Final 03/30/2019 04:37 pm) ---------------------------------------------------------------------- Patient Info  ID #:       [id number]                          D.O.B.:  1975-09-15 (43 yrs)  Name:       Leah Fernandez              Visit Date: 03/28/2019 10:18 am ---------------------------------------------------------------------- Performed By  Performed By:     Shauna Hugh Day RNC          Ref. Address:      [address]                                                              [address]  Attending:        Aletha Halim MD     [facility]  Referred By:      [facility] [place] ---------------------------------------------------------------------- Orders   #  Description                          Code         Ordered By   1  US FETAL BPP W/NONSTRESS             94854.6      Aletha Halim  ----------------------------------------------------------------------   #  Order #                    Accession #                 Episode #   1  [id number]                  [id number]                  [id number]  ---------------------------------------------------------------------- Service(s) Provided   US Fetal BPP W NST                                    17510   ---------------------------------------------------------------------- Indications   [redacted] weeks gestation of pregnancy                Z3A.54   Advanced maternal age multigravida 73+,        O91.523   third trimester  ---------------------------------------------------------------------- Vital Signs                                                 Height:        5'1" ---------------------------------------------------------------------- Fetal Evaluation  Num Of Fetuses:          1  Preg. Location:          Intrauterine  Cardiac Activity:        Observed  Presentation:            Cephalic  Amniotic Fluid  AFI FV:      Within normal limits  AFI Sum(cm)     %Tile       Largest Pocket(cm)  15.25           56          8.64  RUQ(cm)       RLQ(cm)       LUQ(cm)        LLQ(cm)  8.64          3.18          0              3.43 ---------------------------------------------------------------------- Biophysical Evaluation  Amniotic F.V:   Pocket => 2 cm             F. Tone:         Observed  F. Movement:    Observed                   N.S.T:           Reactive  F. Breathing:   Observed                   Score:  10/10 ---------------------------------------------------------------------- OB History  Gravidity:    6         Term:   5        Prem:   0  Living:       5 ---------------------------------------------------------------------- Gestational Age  LMP:           36w 1d        Date:  07/18/18                 EDD:   04/24/19  Best:          36w 1d     Det. By:  LMP  (07/18/18)          EDD:   04/24/19 ---------------------------------------------------------------------- Impression  Reassuring fetal testing ---------------------------------------------------------------------- Recommendations  Continue weekly testing ----------------------------------------------------------------------                 Aletha Halim, MD Electronically Signed Final Report   03/30/2019 04:37 pm  ----------------------------------------------------------------------   Future Appointments  Date Time Provider [place]  04/11/2019  8:15 AM WOC-WOCA NST [place]  04/11/2019  9:15 AM Aletha Halim, MD WOC-WOCA [place]  04/18/2019  9:15 AM WOC-WOCA NST [facility]  04/18/2019 10:15 AM Woodroe Mode, MD [place] WOC    Discharge Condition: Stable  Discharge disposition: 01-Home or Self Care       Discharge Instructions    Discharge activity:  No Restrictions   Complete by: As directed    Discharge diet:  No restrictions   Complete by: As directed    Fetal Kick Count:  Lie on our left side for one hour after a meal, and count the number of times your baby kicks.  If it is less than 5 times, get up, move around and drink some juice.  Repeat the test 30 minutes later.  If it is still less than 5 kicks in an hour, notify your doctor.   Complete by: As directed    No sexual activity restrictions   Complete by: As directed      Allergies as of 04/01/2019   No Known Allergies     Medication List    TAKE these medications   aspirin EC 81 MG tablet Take 1 tablet (81 mg total) by mouth daily.   cyclobenzaprine 10 MG tablet Commonly known as: FLEXERIL Take 1 tablet (10 mg total) by mouth 3 (three) times daily as needed for muscle spasms.      Follow-up [place] for [facility] Follow up.   Specialty: Obstetrics and Gynecology Contact information: [address] [place], [place] [id number] [place] [id number] [phone]          Signed: Donnamae Jude M.D. 04/01/2019, 12:19 PM

## 2019-04-04 ENCOUNTER — Encounter: Payer: Medicaid Other | Admitting: Family Medicine

## 2019-04-04 ENCOUNTER — Other Ambulatory Visit: Payer: Medicaid Other

## 2019-04-05 ENCOUNTER — Inpatient Hospital Stay (HOSPITAL_COMMUNITY)
Admission: AD | Admit: 2019-04-05 | Discharge: 2019-04-08 | DRG: 805 | Disposition: A | Discharge status: Home / Self Care | Payer: Medicaid Other | Attending: Internal Medicine | Admitting: Internal Medicine

## 2019-04-05 ENCOUNTER — Inpatient Hospital Stay (HOSPITAL_COMMUNITY): Payer: Medicaid Other

## 2019-04-05 ENCOUNTER — Other Ambulatory Visit: Payer: Self-pay

## 2019-04-05 ENCOUNTER — Encounter (HOSPITAL_COMMUNITY): Payer: Self-pay | Admitting: Obstetrics & Gynecology

## 2019-04-05 DIAGNOSIS — J1282 Pneumonia due to coronavirus disease 2019: Secondary | ICD-10-CM | POA: Diagnosis present

## 2019-04-05 DIAGNOSIS — Z30430 Encounter for insertion of intrauterine contraceptive device: Secondary | ICD-10-CM

## 2019-04-05 DIAGNOSIS — J189 Pneumonia, unspecified organism: Secondary | ICD-10-CM | POA: Diagnosis not present

## 2019-04-05 DIAGNOSIS — R05 Cough: Secondary | ICD-10-CM | POA: Diagnosis not present

## 2019-04-05 DIAGNOSIS — Z23 Encounter for immunization: Secondary | ICD-10-CM

## 2019-04-05 DIAGNOSIS — O368390 Maternal care for abnormalities of the fetal heart rate or rhythm, unspecified trimester, not applicable or unspecified: Secondary | ICD-10-CM | POA: Diagnosis present

## 2019-04-05 DIAGNOSIS — Z3A37 37 weeks gestation of pregnancy: Secondary | ICD-10-CM | POA: Diagnosis not present

## 2019-04-05 DIAGNOSIS — U071 COVID-19: Secondary | ICD-10-CM | POA: Diagnosis present

## 2019-04-05 DIAGNOSIS — J9601 Acute respiratory failure with hypoxia: Secondary | ICD-10-CM | POA: Diagnosis present

## 2019-04-05 DIAGNOSIS — R509 Fever, unspecified: Secondary | ICD-10-CM | POA: Diagnosis not present

## 2019-04-05 DIAGNOSIS — R058 Other specified cough: Secondary | ICD-10-CM

## 2019-04-05 DIAGNOSIS — R0902 Hypoxemia: Secondary | ICD-10-CM | POA: Diagnosis not present

## 2019-04-05 DIAGNOSIS — O9852 Other viral diseases complicating childbirth: Secondary | ICD-10-CM | POA: Diagnosis not present

## 2019-04-05 DIAGNOSIS — O9952 Diseases of the respiratory system complicating childbirth: Principal | ICD-10-CM | POA: Diagnosis present

## 2019-04-05 LAB — CBC WITH DIFFERENTIAL/PLATELET
Abs Immature Granulocytes: 0.1 10*3/uL — ABNORMAL HIGH (ref 0.00–0.07)
Basophils Absolute: 0 10*3/uL (ref 0.0–0.1)
Basophils Relative: 0 %
Eosinophils Absolute: 0 10*3/uL (ref 0.0–0.5)
Eosinophils Relative: 0 %
HCT: 35.4 % — ABNORMAL LOW (ref 36.0–46.0)
Hemoglobin: 11.9 g/dL — ABNORMAL LOW (ref 12.0–15.0)
Immature Granulocytes: 1 %
Lymphocytes Relative: 17 %
Lymphs Abs: 1.5 10*3/uL (ref 0.7–4.0)
MCH: 27 pg (ref 26.0–34.0)
MCHC: 33.6 g/dL (ref 30.0–36.0)
MCV: 80.3 fL (ref 80.0–100.0)
Monocytes Absolute: 0.5 10*3/uL (ref 0.1–1.0)
Monocytes Relative: 6 %
Neutro Abs: 6.6 10*3/uL (ref 1.7–7.7)
Neutrophils Relative %: 76 %
Platelets: 269 10*3/uL (ref 150–400)
RBC: 4.41 MIL/uL (ref 3.87–5.11)
RDW: 13.8 % (ref 11.5–15.5)
WBC: 8.7 10*3/uL (ref 4.0–10.5)
nRBC: 0 % (ref 0.0–0.2)

## 2019-04-05 LAB — URINALYSIS, ROUTINE W REFLEX MICROSCOPIC
Bilirubin Urine: NEGATIVE
Glucose, UA: NEGATIVE mg/dL
Hgb urine dipstick: NEGATIVE
Ketones, ur: 20 mg/dL — AB
Leukocytes,Ua: NEGATIVE
Nitrite: NEGATIVE
Protein, ur: NEGATIVE mg/dL
Specific Gravity, Urine: 1.017 (ref 1.005–1.030)
pH: 6 (ref 5.0–8.0)

## 2019-04-05 LAB — FERRITIN: Ferritin: 74 ng/mL (ref 11–307)

## 2019-04-05 LAB — COMPREHENSIVE METABOLIC PANEL
ALT: 14 U/L (ref 0–44)
AST: 25 U/L (ref 15–41)
Albumin: 2.4 g/dL — ABNORMAL LOW (ref 3.5–5.0)
Alkaline Phosphatase: 121 U/L (ref 38–126)
Anion gap: 12 (ref 5–15)
BUN: 14 mg/dL (ref 6–20)
CO2: 16 mmol/L — ABNORMAL LOW (ref 22–32)
Calcium: 8.3 mg/dL — ABNORMAL LOW (ref 8.9–10.3)
Chloride: 106 mmol/L (ref 98–111)
Creatinine, Ser: 0.66 mg/dL (ref 0.44–1.00)
GFR calc Af Amer: >60 mL/min (ref >60)
GFR calc non Af Amer: >60 mL/min (ref >60)
Glucose, Bld: 91 mg/dL (ref 70–99)
Potassium: 3.1 mmol/L — ABNORMAL LOW (ref 3.5–5.1)
Sodium: 134 mmol/L — ABNORMAL LOW (ref 135–145)
Total Bilirubin: 0.5 mg/dL (ref 0.3–1.2)
Total Protein: 6.4 g/dL — ABNORMAL LOW (ref 6.5–8.1)

## 2019-04-05 LAB — TYPE AND SCREEN
ABO/RH(D): A POS
Antibody Screen: NEGATIVE

## 2019-04-05 LAB — C-REACTIVE PROTEIN: CRP: 8.1 mg/dL — ABNORMAL HIGH (ref <1.0)

## 2019-04-05 LAB — D-DIMER, QUANTITATIVE: D-Dimer, Quant: 1.18 ug/mL-FEU — ABNORMAL HIGH (ref 0.00–0.50)

## 2019-04-05 LAB — PROCALCITONIN: Procalcitonin: <0.1 ng/mL

## 2019-04-05 MED ORDER — ACETAMINOPHEN 500 MG PO TABS: 500.0000 mg | ORAL_TABLET | Freq: Four times a day (QID) | ORAL | Status: DC | PRN

## 2019-04-05 MED ORDER — DEXAMETHASONE SODIUM PHOSPHATE 10 MG/ML IJ SOLN
10.0000 mg | Freq: Once | INTRAMUSCULAR | Status: AC
  Administered 2019-04-05: 10 mg via INTRAVENOUS
  Filled 2019-04-05: qty 1

## 2019-04-05 MED ORDER — ASPIRIN EC 81 MG PO TBEC: 81.0000 mg | DELAYED_RELEASE_TABLET | Freq: Every day | ORAL | Status: DC

## 2019-04-05 MED ORDER — ACETAMINOPHEN 325 MG PO TABS
650.0000 mg | ORAL_TABLET | ORAL | Status: DC | PRN
  Administered 2019-04-06 (×2): 650 mg via ORAL
  Filled 2019-04-05 (×2): qty 2

## 2019-04-05 MED ORDER — DEXAMETHASONE 6 MG PO TABS
6.0000 mg | ORAL_TABLET | Freq: Every day | ORAL | Status: DC
  Administered 2019-04-06 – 2019-04-08 (×3): 6 mg via ORAL
  Filled 2019-04-05 (×3): qty 1

## 2019-04-05 MED ORDER — LACTATED RINGERS IV BOLUS
1000.0000 mL | Freq: Once | INTRAVENOUS | Status: AC
  Administered 2019-04-05: 1000 mL via INTRAVENOUS

## 2019-04-05 MED ORDER — CALCIUM CARBONATE ANTACID 500 MG PO CHEW: 2.0000 | CHEWABLE_TABLET | ORAL | Status: DC | PRN

## 2019-04-05 MED ORDER — DOCUSATE SODIUM 100 MG PO CAPS: 100.0000 mg | ORAL_CAPSULE | Freq: Every day | ORAL | Status: DC

## 2019-04-05 MED ORDER — PRENATAL MULTIVITAMIN CH
1.0000 | ORAL_TABLET | Freq: Every day | ORAL | Status: DC
  Administered 2019-04-07: 1 via ORAL
  Filled 2019-04-05 (×2): qty 1

## 2019-04-05 MED ORDER — LACTATED RINGERS IV SOLN: INTRAVENOUS | Status: DC

## 2019-04-05 MED ORDER — PRENATAL MULTIVITAMIN CH: 1.0000 | ORAL_TABLET | Freq: Every day | ORAL | Status: DC

## 2019-04-05 MED ORDER — POTASSIUM CHLORIDE 2 MEQ/ML IV SOLN
INTRAVENOUS | Status: DC
  Filled 2019-04-05 (×3): qty 1000

## 2019-04-05 MED ORDER — CYCLOBENZAPRINE HCL 10 MG PO TABS
10.0000 mg | ORAL_TABLET | Freq: Three times a day (TID) | ORAL | Status: DC | PRN
  Administered 2019-04-07: 10 mg via ORAL
  Filled 2019-04-05: qty 1

## 2019-04-05 MED ORDER — ZOLPIDEM TARTRATE 5 MG PO TABS: 5.0000 mg | ORAL_TABLET | Freq: Every evening | ORAL | Status: DC | PRN

## 2019-04-05 NOTE — MAU Provider Note (Addendum)
History    CSN: [id number]  Arrival date and time: 04/05/19 0541  First Provider Initiated Contact with Patient 04/05/19 [phone]     Chief Complaint  Patient presents with  . Fever   HPI Steven Veazie is a 44 y.o. [phone] at 105w2d who presents to MAU with chief complaint of recurrent fever and productive cough in the setting of positive COVID diagnosis 03/31/2019. Patient reports oral temp of 102. She took Tylenol at home around 0400 and her oral temperature returned to normal but she is concerned that her fever returns as soon as the Tylenol wears off.  Patient endorses new onset productive cough. She denies SOB but endorses feeling sore in her chest from coughing. She denies weakness, syncope, palpitations.  Patient reports irregular mild abdominal contractions. This is an existing problem. She denies vaginal bleeding, leaking of fluid, decreased fetal movement, fever, falls, or recent illness.   Patient's is s/p admission 03/31/2019 with discharge home on 04/01/2019. She states that she felt fine at the time of discharge then started to feel exhausted with fever and muscle aches within a couple hours of her arrival home on 04/01/2019. She states that her husband and all of their children are COVID positive and quarantining at home.  OB History    Gravida  6   Para  5   Term  5   Preterm  0   AB  0   Living  5     SAB  0   TAB  0   Ectopic  0   Multiple  0   Live Births  5           Past Medical History:  Diagnosis Date  . Asthma   . Cervix dysplasia     Past Surgical History:  Procedure Laterality Date  . LEEP      Family History  Problem Relation Age of Onset  . Diabetes Mother   . Hypertension Sister   . Cancer Sister     Social History   Tobacco Use  . Smoking status: Never Smoker  . Smokeless tobacco: Never Used  Substance Use Topics  . Alcohol use: No  . Drug use: No    Allergies: No Known Allergies  Medications Prior to  Admission  Medication Sig Dispense Refill Last Dose  . acetaminophen (TYLENOL) 500 MG tablet Take 500 mg by mouth every 6 (six) hours as needed.     Marland Kitchen aspirin EC 81 MG tablet Take 1 tablet (81 mg total) by mouth daily. 60 tablet 2 Past Week at Unknown time  . cyclobenzaprine (FLEXERIL) 10 MG tablet Take 1 tablet (10 mg total) by mouth 3 (three) times daily as needed for muscle spasms. 30 tablet 0 04/05/2019 at Unknown time  . Prenatal Vit-Fe Fumarate-FA (PRENATAL MULTIVITAMIN) TABS tablet Take 1 tablet by mouth daily at 12 noon.       Review of Systems  Constitutional: Positive for fatigue and fever.  Respiratory: Positive for cough. Negative for shortness of breath.   Cardiovascular: Negative for leg swelling.  Gastrointestinal: Positive for abdominal pain. Negative for nausea and vomiting.  Genitourinary: Negative for difficulty urinating, vaginal bleeding, vaginal discharge and vaginal pain.  Musculoskeletal: Negative for back pain.  Neurological: Negative for dizziness, syncope, weakness and headaches.  All other systems reviewed and are negative.  Physical Exam   Patient Vitals for the past 24 hrs:  BP Temp Temp src Pulse Resp SpO2  04/05/19 0833 (!) 92/44 97.9 F (36.6  C) Oral 96 18 --  04/05/19 0831 -- -- -- 85 -- 97 %  04/05/19 0810 -- -- -- 90 -- 98 %  04/05/19 0700 -- -- -- -- -- 99 %  04/05/19 0603 (!) 99/55 98 F (36.7 C) Oral (!) 117 20 --     Physical Exam  Nursing note and vitals reviewed. Constitutional: She is oriented to person, place, and time. She appears well-developed and well-nourished. She has a sickly appearance. She appears ill.  Eyes: Conjunctivae are normal.  Cardiovascular: Normal rate.  Respiratory: Effort normal and breath sounds normal. No accessory muscle usage. No respiratory distress. She has no decreased breath sounds. She has no wheezes. She has no rhonchi. She has no rales.  GI: Soft. She exhibits no distension. There is no abdominal  tenderness. There is no rebound, no guarding and no CVA tenderness.  Gravid  Neurological: She is alert and oriented to person, place, and time.  Skin: Skin is warm and dry.  Psychiatric: She has a normal mood and affect. Her behavior is normal. Judgment and thought content normal.    MAU Course/MDM  Procedures  --Patient originally reported onset of symptoms as Monday 03/28/2019 --Lab and imaging results discussed with Dr. Despina Hidden at [phone] --Tracing reactive after 1L IV fluid bolus. Concern for decel at 0757 --VE: 1/60/-3 vertex by suture  Patient Vitals for the past 24 hrs:  BP Temp Temp src Pulse Resp  04/05/19 0603 (!) 99/55 98 F (36.7 C) Oral (!) 117 20   Results for orders placed or performed during the hospital encounter of 04/05/19 (from the past 24 hour(s))  Urinalysis, Routine w reflex microscopic     Status: Abnormal   Collection Time: 04/05/19  5:56 AM  Result Value Ref Range   Color, Urine YELLOW YELLOW   APPearance CLEAR CLEAR   Specific Gravity, Urine 1.017 1.005 - 1.030   pH 6.0 5.0 - 8.0   Glucose, UA NEGATIVE NEGATIVE mg/dL   Hgb urine dipstick NEGATIVE NEGATIVE   Bilirubin Urine NEGATIVE NEGATIVE   Ketones, ur 20 (A) NEGATIVE mg/dL   Protein, ur NEGATIVE NEGATIVE mg/dL   Nitrite NEGATIVE NEGATIVE   Leukocytes,Ua NEGATIVE NEGATIVE  CBC with Differential/Platelet     Status: Abnormal   Collection Time: 04/05/19  6:36 AM  Result Value Ref Range   WBC 8.7 4.0 - 10.5 K/uL   RBC 4.41 3.87 - 5.11 MIL/uL   Hemoglobin 11.9 (L) 12.0 - 15.0 g/dL   HCT 06.3 (L) 01.6 - 01.0 %   MCV 80.3 80.0 - 100.0 fL   MCH 27.0 26.0 - 34.0 pg   MCHC 33.6 30.0 - 36.0 g/dL   RDW 93.2 35.5 - 73.2 %   Platelets 269 150 - 400 K/uL   nRBC 0.0 0.0 - 0.2 %   Neutrophils Relative % 76 %   Neutro Abs 6.6 1.7 - 7.7 K/uL   Lymphocytes Relative 17 %   Lymphs Abs 1.5 0.7 - 4.0 K/uL   Monocytes Relative 6 %   Monocytes Absolute 0.5 0.1 - 1.0 K/uL   Eosinophils Relative 0 %   Eosinophils  Absolute 0.0 0.0 - 0.5 K/uL   Basophils Relative 0 %   Basophils Absolute 0.0 0.0 - 0.1 K/uL   Immature Granulocytes 1 %   Abs Immature Granulocytes 0.10 (H) 0.00 - 0.07 K/uL  Comprehensive metabolic panel     Status: Abnormal   Collection Time: 04/05/19  6:36 AM  Result Value Ref Range   Sodium  134 (L) 135 - 145 mmol/L   Potassium 3.1 (L) 3.5 - 5.1 mmol/L   Chloride 106 98 - 111 mmol/L   CO2 16 (L) 22 - 32 mmol/L   Glucose, Bld 91 70 - 99 mg/dL   BUN 14 6 - 20 mg/dL   Creatinine, Ser 0.66 0.44 - 1.00 mg/dL   Calcium 8.3 (L) 8.9 - 10.3 mg/dL   Total Protein 6.4 (L) 6.5 - 8.1 g/dL   Albumin 2.4 (L) 3.5 - 5.0 g/dL   AST 25 15 - 41 U/L   ALT 14 0 - 44 U/L   Alkaline Phosphatase 121 38 - 126 U/L   Total Bilirubin 0.5 0.3 - 1.2 mg/dL   GFR calc non Af Amer >60 >60 mL/min   GFR calc Af Amer >60 >60 mL/min   Anion gap 12 5 - 15   DG Chest Port 1 View  Result Date: 04/05/2019 CLINICAL DATA:  Pneumonia.  COVID. EXAM: PORTABLE CHEST 1 VIEW COMPARISON:  Five days ago FINDINGS: Low volume chest with suspected subtle infiltrates about the right hilum and peripheral left base. No edema, effusion, or pneumothorax. Normal heart size and mediastinal contours. IMPRESSION: Low volume chest with concern for early bilateral infiltrates. Electronically Signed   By: Monte Fantasia M.D.   On: 04/05/2019 07:32   Report given to E. Purcell Nails, NP who assumes care of patient at this time  Mallie Snooks, MSN, CNM Certified Nurse Midwife, [facility] for [employer], [place] 04/05/19 8:05 AM   Prolonged decel x 2.  Resolved with maternal position changes & IV fluid bolus. Cervix unchanged at 1/50/-3 Dr. Nehemiah Settle on unit to speak with patient Spanish interpreter at bedside for this conversation.  Assessment and Plan   A: COVID positive Fetal decelerations affecting care of mother in third trimester [redacted] weeks gestation  P: Admit to [facility] unit Labs  pending Continuous monitoring  Jorje Guild, NP

## 2019-04-05 NOTE — H&P (Signed)
History    CSN: [id number]  Arrival date and time: 04/05/19 0541  First Provider Initiated Contact with Patient 04/05/19 [phone]     Chief Complaint  Patient presents with  . Fever   HPI Leah Fernandez is a 44 y.o. [phone] at [redacted]w[redacted]d who presents to MAU with chief complaint of recurrent fever and productive cough in the setting of positive COVID diagnosis 03/31/2019. Patient reports oral temp of 102. She took Tylenol at home around 0400 and her oral temperature returned to normal but she is concerned that her fever returns as soon as the Tylenol wears off.  Patient endorses new onset productive cough. She denies SOB but endorses feeling sore in her chest from coughing. She denies weakness, syncope, palpitations.  Patient reports irregular mild abdominal contractions. This is an existing problem. She denies vaginal bleeding, leaking of fluid, decreased fetal movement, fever, falls, or recent illness.   Patient's is s/p admission 03/31/2019 with discharge home on 04/01/2019. She states that she felt fine at the time of discharge then started to feel exhausted with fever and muscle aches within a couple hours of her arrival home on 04/01/2019. She states that her husband and all of their children are COVID positive and quarantining at home.  OB History     Gravida  6   Para  5   Term  5   Preterm  0   AB  0   Living  5      SAB  0   TAB  0   Ectopic  0   Multiple  0   Live Births  5           Past Medical History:  Diagnosis Date  . Asthma   . Cervix dysplasia     Past Surgical History:  Procedure Laterality Date  . LEEP      Family History  Problem Relation Age of Onset  . Diabetes Mother   . Hypertension Sister   . Cancer Sister     Social History   Tobacco Use  . Smoking status: Never Smoker  . Smokeless tobacco: Never Used  Substance Use Topics  . Alcohol use: No  . Drug use: No    Allergies: No Known Allergies  Medications Prior to  Admission  Medication Sig Dispense Refill Last Dose  . acetaminophen (TYLENOL) 500 MG tablet Take 500 mg by mouth every 6 (six) hours as needed.     Marland Kitchen aspirin EC 81 MG tablet Take 1 tablet (81 mg total) by mouth daily. 60 tablet 2 Past Week at Unknown time  . cyclobenzaprine (FLEXERIL) 10 MG tablet Take 1 tablet (10 mg total) by mouth 3 (three) times daily as needed for muscle spasms. 30 tablet 0 04/05/2019 at Unknown time  . Prenatal Vit-Fe Fumarate-FA (PRENATAL MULTIVITAMIN) TABS tablet Take 1 tablet by mouth daily at 12 noon.       Review of Systems  Constitutional: Positive for fatigue and fever.  Respiratory: Positive for cough. Negative for shortness of breath.   Cardiovascular: Negative for leg swelling.  Gastrointestinal: Positive for abdominal pain. Negative for nausea and vomiting.  Genitourinary: Negative for difficulty urinating, vaginal bleeding, vaginal discharge and vaginal pain.  Musculoskeletal: Negative for back pain.  Neurological: Negative for dizziness, syncope, weakness and headaches.  All other systems reviewed and are negative.  Physical Exam   Patient Vitals for the past 24 hrs:  BP Temp Temp src Pulse Resp SpO2  04/05/19 0833 (!) 92/44 97.9  F (36.6 C) Oral 96 18 --  04/05/19 0831 -- -- -- 85 -- 97 %  04/05/19 0810 -- -- -- 90 -- 98 %  04/05/19 0700 -- -- -- -- -- 99 %  04/05/19 0603 (!) 99/55 98 F (36.7 C) Oral (!) 117 20 --     Physical Exam  Nursing note and vitals reviewed. Constitutional: She is oriented to person, place, and time. She appears well-developed and well-nourished. She has a sickly appearance. She appears ill.  Eyes: Conjunctivae are normal.  Cardiovascular: Normal rate.  Respiratory: Effort normal and breath sounds normal. No accessory muscle usage. No respiratory distress. She has no decreased breath sounds. She has no wheezes. She has no rhonchi. She has no rales.  GI: Soft. She exhibits no distension. There is no abdominal  tenderness. There is no rebound, no guarding and no CVA tenderness.  Gravid  Neurological: She is alert and oriented to person, place, and time.  Skin: Skin is warm and dry.  Psychiatric: She has a normal mood and affect. Her behavior is normal. Judgment and thought content normal.    MAU Course/MDM  Procedures  --Patient originally reported onset of symptoms as Monday 03/28/2019 --Lab and imaging results discussed with Dr. Despina Hidden at [phone] --Tracing reactive after 1L IV fluid bolus. Concern for decel at 0757 --VE: 1/60/-3 vertex by suture  Patient Vitals for the past 24 hrs:  BP Temp Temp src Pulse Resp  04/05/19 0603 (!) 99/55 98 F (36.7 C) Oral (!) 117 20   Results for orders placed or performed during the hospital encounter of 04/05/19 (from the past 24 hour(s))  Urinalysis, Routine w reflex microscopic     Status: Abnormal   Collection Time: 04/05/19  5:56 AM  Result Value Ref Range   Color, Urine YELLOW YELLOW   APPearance CLEAR CLEAR   Specific Gravity, Urine 1.017 1.005 - 1.030   pH 6.0 5.0 - 8.0   Glucose, UA NEGATIVE NEGATIVE mg/dL   Hgb urine dipstick NEGATIVE NEGATIVE   Bilirubin Urine NEGATIVE NEGATIVE   Ketones, ur 20 (A) NEGATIVE mg/dL   Protein, ur NEGATIVE NEGATIVE mg/dL   Nitrite NEGATIVE NEGATIVE   Leukocytes,Ua NEGATIVE NEGATIVE  CBC with Differential/Platelet     Status: Abnormal   Collection Time: 04/05/19  6:36 AM  Result Value Ref Range   WBC 8.7 4.0 - 10.5 K/uL   RBC 4.41 3.87 - 5.11 MIL/uL   Hemoglobin 11.9 (L) 12.0 - 15.0 g/dL   HCT 31.4 (L) 97.0 - 26.3 %   MCV 80.3 80.0 - 100.0 fL   MCH 27.0 26.0 - 34.0 pg   MCHC 33.6 30.0 - 36.0 g/dL   RDW 78.5 88.5 - 02.7 %   Platelets 269 150 - 400 K/uL   nRBC 0.0 0.0 - 0.2 %   Neutrophils Relative % 76 %   Neutro Abs 6.6 1.7 - 7.7 K/uL   Lymphocytes Relative 17 %   Lymphs Abs 1.5 0.7 - 4.0 K/uL   Monocytes Relative 6 %   Monocytes Absolute 0.5 0.1 - 1.0 K/uL   Eosinophils Relative 0 %   Eosinophils  Absolute 0.0 0.0 - 0.5 K/uL   Basophils Relative 0 %   Basophils Absolute 0.0 0.0 - 0.1 K/uL   Immature Granulocytes 1 %   Abs Immature Granulocytes 0.10 (H) 0.00 - 0.07 K/uL  Comprehensive metabolic panel     Status: Abnormal   Collection Time: 04/05/19  6:36 AM  Result Value Ref Range  Sodium 134 (L) 135 - 145 mmol/L   Potassium 3.1 (L) 3.5 - 5.1 mmol/L   Chloride 106 98 - 111 mmol/L   CO2 16 (L) 22 - 32 mmol/L   Glucose, Bld 91 70 - 99 mg/dL   BUN 14 6 - 20 mg/dL   Creatinine, Ser 0.66 0.44 - 1.00 mg/dL   Calcium 8.3 (L) 8.9 - 10.3 mg/dL   Total Protein 6.4 (L) 6.5 - 8.1 g/dL   Albumin 2.4 (L) 3.5 - 5.0 g/dL   AST 25 15 - 41 U/L   ALT 14 0 - 44 U/L   Alkaline Phosphatase 121 38 - 126 U/L   Total Bilirubin 0.5 0.3 - 1.2 mg/dL   GFR calc non Af Amer >60 >60 mL/min   GFR calc Af Amer >60 >60 mL/min   Anion gap 12 5 - 15   DG Chest Port 1 View  Result Date: 04/05/2019 CLINICAL DATA:  Pneumonia.  COVID. EXAM: PORTABLE CHEST 1 VIEW COMPARISON:  Five days ago FINDINGS: Low volume chest with suspected subtle infiltrates about the right hilum and peripheral left base. No edema, effusion, or pneumothorax. Normal heart size and mediastinal contours. IMPRESSION: Low volume chest with concern for early bilateral infiltrates. Electronically Signed   By: Monte Fantasia M.D.   On: 04/05/2019 07:32   Report given to E. Purcell Nails, NP who assumes care of patient at this time  Mallie Snooks, MSN, CNM Certified Nurse Midwife, [facility] for [employer], [employer] 04/05/19 8:05 AM   Prolonged decel x 2.  Resolved with maternal position changes & IV fluid bolus. Cervix unchanged at 1/50/-3 Dr. Nehemiah Settle on unit to speak with patient Spanish interpreter at bedside for this conversation.  Assessment and Plan   A: COVID positive Fetal decelerations affecting care of mother in third trimester [redacted] weeks gestation  P: Admit to [facility] unit Labs  pending Continuous monitoring Continue dexamethasone.

## 2019-04-05 NOTE — MAU Note (Signed)
PT SAYS HER TEMP AT HOME WAS 102 - TOOK TYLENOL AT 0400. 1 XS .  TEMP NOW 98.0.   POSITIVE FOR COVID.  EVERYONE IN FAMILY HAS COVID.

## 2019-04-06 ENCOUNTER — Inpatient Hospital Stay (HOSPITAL_COMMUNITY): Payer: Medicaid Other

## 2019-04-06 ENCOUNTER — Inpatient Hospital Stay (HOSPITAL_COMMUNITY): Payer: Medicaid Other | Admitting: Anesthesiology

## 2019-04-06 ENCOUNTER — Encounter (HOSPITAL_COMMUNITY): Payer: Self-pay | Admitting: Family Medicine

## 2019-04-06 ENCOUNTER — Other Ambulatory Visit: Payer: Self-pay

## 2019-04-06 DIAGNOSIS — O368390 Maternal care for abnormalities of the fetal heart rate or rhythm, unspecified trimester, not applicable or unspecified: Secondary | ICD-10-CM

## 2019-04-06 DIAGNOSIS — Z3A37 37 weeks gestation of pregnancy: Secondary | ICD-10-CM

## 2019-04-06 DIAGNOSIS — O9852 Other viral diseases complicating childbirth: Secondary | ICD-10-CM

## 2019-04-06 DIAGNOSIS — Z30430 Encounter for insertion of intrauterine contraceptive device: Secondary | ICD-10-CM

## 2019-04-06 DIAGNOSIS — U071 COVID-19: Secondary | ICD-10-CM

## 2019-04-06 DIAGNOSIS — R0902 Hypoxemia: Secondary | ICD-10-CM

## 2019-04-06 LAB — GLUCOSE, CAPILLARY: Glucose-Capillary: 142 mg/dL — ABNORMAL HIGH (ref 70–99)

## 2019-04-06 LAB — MRSA PCR SCREENING: MRSA by PCR: NEGATIVE

## 2019-04-06 MED ORDER — LEVONORGESTREL 19.5 MCG/DAY IU IUD
INTRAUTERINE_SYSTEM | Freq: Once | INTRAUTERINE | Status: AC
  Administered 2019-04-06: 1 via INTRAUTERINE
  Filled 2019-04-06: qty 1

## 2019-04-06 MED ORDER — TERBUTALINE SULFATE 1 MG/ML IJ SOLN
0.2500 mg | Freq: Once | INTRAMUSCULAR | Status: DC | PRN
  Filled 2019-04-06: qty 1

## 2019-04-06 MED ORDER — LACTATED RINGERS IV BOLUS
1000.0000 mL | Freq: Once | INTRAVENOUS | Status: AC
  Administered 2019-04-06: 1000 mL via INTRAVENOUS

## 2019-04-06 MED ORDER — DIPHENHYDRAMINE HCL 50 MG/ML IJ SOLN: 12.5000 mg | INTRAMUSCULAR | Status: DC | PRN

## 2019-04-06 MED ORDER — LIDOCAINE-EPINEPHRINE (PF) 2 %-1:200000 IJ SOLN
INTRAMUSCULAR | Status: DC | PRN
  Administered 2019-04-06 (×2): 2 mL via EPIDURAL

## 2019-04-06 MED ORDER — SODIUM CHLORIDE (PF) 0.9 % IJ SOLN
INTRAMUSCULAR | Status: DC | PRN
  Administered 2019-04-06: 12 mL/h via EPIDURAL

## 2019-04-06 MED ORDER — FENTANYL CITRATE (PF) 100 MCG/2ML IJ SOLN: 100.0000 ug | INTRAMUSCULAR | Status: DC | PRN

## 2019-04-06 MED ORDER — SODIUM CHLORIDE 0.9 % IV SOLN
100.0000 mg | Freq: Every day | INTRAVENOUS | Status: DC
  Administered 2019-04-07 – 2019-04-08 (×2): 100 mg via INTRAVENOUS
  Filled 2019-04-06 (×3): qty 20

## 2019-04-06 MED ORDER — PHENYLEPHRINE 40 MCG/ML (10ML) SYRINGE FOR IV PUSH (FOR BLOOD PRESSURE SUPPORT)
80.0000 ug | PREFILLED_SYRINGE | INTRAVENOUS | Status: DC | PRN
  Filled 2019-04-06 (×2): qty 10

## 2019-04-06 MED ORDER — SODIUM CHLORIDE 0.9 % IV SOLN
200.0000 mg | Freq: Once | INTRAVENOUS | Status: AC
  Administered 2019-04-06: 200 mg via INTRAVENOUS
  Filled 2019-04-06: qty 200

## 2019-04-06 MED ORDER — OXYCODONE HCL 5 MG PO TABS
5.0000 mg | ORAL_TABLET | ORAL | Status: DC | PRN
  Administered 2019-04-06 – 2019-04-07 (×2): 10 mg via ORAL
  Administered 2019-04-07: 5 mg via ORAL
  Filled 2019-04-06: qty 1
  Filled 2019-04-06: qty 2
  Filled 2019-04-06: qty 1
  Filled 2019-04-06 (×2): qty 2

## 2019-04-06 MED ORDER — FENTANYL-BUPIVACAINE-NACL 0.5-0.125-0.9 MG/250ML-% EP SOLN
12.0000 mL/h | EPIDURAL | Status: DC | PRN
  Filled 2019-04-06: qty 250

## 2019-04-06 MED ORDER — GUAIFENESIN-DM 100-10 MG/5ML PO SYRP
10.0000 mL | ORAL_SOLUTION | ORAL | Status: DC | PRN
  Filled 2019-04-06: qty 10

## 2019-04-06 MED ORDER — EPHEDRINE 5 MG/ML INJ
10.0000 mg | INTRAVENOUS | Status: DC | PRN
  Filled 2019-04-06: qty 2

## 2019-04-06 MED ORDER — LACTATED RINGERS AMNIOINFUSION
INTRAVENOUS | Status: DC
  Filled 2019-04-06 (×2): qty 1000

## 2019-04-06 MED ORDER — ASCORBIC ACID 500 MG PO TABS
500.0000 mg | ORAL_TABLET | Freq: Every day | ORAL | Status: DC
  Administered 2019-04-06 – 2019-04-08 (×3): 500 mg via ORAL
  Filled 2019-04-06 (×3): qty 1

## 2019-04-06 MED ORDER — ACETAMINOPHEN 500 MG PO TABS
1000.0000 mg | ORAL_TABLET | Freq: Once | ORAL | Status: AC
  Administered 2019-04-06: 1000 mg via ORAL
  Filled 2019-04-06: qty 2

## 2019-04-06 MED ORDER — PNEUMOCOCCAL VAC POLYVALENT 25 MCG/0.5ML IJ INJ
0.5000 mL | INJECTION | INTRAMUSCULAR | Status: AC
  Administered 2019-04-07: 0.5 mL via INTRAMUSCULAR
  Filled 2019-04-06 (×2): qty 0.5

## 2019-04-06 MED ORDER — FENTANYL CITRATE (PF) 100 MCG/2ML IJ SOLN
INTRAMUSCULAR | Status: AC
  Administered 2019-04-06: 100 ug via INTRAVENOUS
  Filled 2019-04-06: qty 2

## 2019-04-06 MED ORDER — LACTATED RINGERS IV SOLN: 500.0000 mL | Freq: Once | INTRAVENOUS | Status: DC

## 2019-04-06 MED ORDER — INSULIN ASPART 100 UNIT/ML ~~LOC~~ SOLN
0.0000 [IU] | SUBCUTANEOUS | Status: DC
  Administered 2019-04-06: 1 [IU] via SUBCUTANEOUS

## 2019-04-06 MED ORDER — ZINC SULFATE 220 (50 ZN) MG PO CAPS
220.0000 mg | ORAL_CAPSULE | Freq: Every day | ORAL | Status: DC
  Administered 2019-04-06 – 2019-04-08 (×3): 220 mg via ORAL
  Filled 2019-04-06 (×3): qty 1

## 2019-04-06 MED ORDER — IPRATROPIUM BROMIDE HFA 17 MCG/ACT IN AERS
2.0000 | INHALATION_SPRAY | Freq: Four times a day (QID) | RESPIRATORY_TRACT | Status: DC
  Administered 2019-04-06 – 2019-04-08 (×6): 2 via RESPIRATORY_TRACT
  Filled 2019-04-06: qty 12.9

## 2019-04-06 MED ORDER — PHENYLEPHRINE 40 MCG/ML (10ML) SYRINGE FOR IV PUSH (FOR BLOOD PRESSURE SUPPORT)
80.0000 ug | PREFILLED_SYRINGE | INTRAVENOUS | Status: DC | PRN
  Filled 2019-04-06: qty 10

## 2019-04-06 MED ORDER — OXYTOCIN 40 UNITS IN NORMAL SALINE INFUSION - SIMPLE MED
1.0000 m[IU]/min | INTRAVENOUS | Status: DC
  Administered 2019-04-06: 2 m[IU]/min via INTRAVENOUS
  Filled 2019-04-06: qty 1000

## 2019-04-06 NOTE — Consult Note (Signed)
[facility] Medical Consultation  Asiya Cutbirth FOY:[id number] DOB: 11/29/1975 DOA: 04/05/2019 PCP: Osborne Oman, MD   Requesting physician: Mathis Dad  Date of consultation: 04/06/2019 Reason for consultation: COVID 19  Impression/Recommendations Active Problems:   Antepartum non-reassuring fetal heart rate or rhythm affecting care of mother    Acute hypoxic respite failure, 2nd to COVID-19 pneumonia.  Discussed  obstetrics attending, agreed with remdesivir and steroid.  Patient plans to do breast-feeding, review on NIH data regarding remdesivir, overall not enough data on safety, will monitor closely of the newborn baby. Titrate oxygen to 93%. Add breathing meds.  Non-anion gap metabolic acidosis, likely from COVID-19 infection, on LR.  Hypokalemia, check in a.m.  Term pregnancy status post vaginal delivery, as per primary team.      I will followup again tomorrow. Please contact me if I can be of assistance in the meanwhile. Thank you for this consultation.  Chief Complaint: Short of breath  HPI:  44 y.o. female without significant medical history who is 37W3Ds gestation and scented with the recurrent fever and dry cough muscle aching feeling tired for 10 days.  Patient was tested + COVID on January 20.  She was treated with Betamethasone and Procardia.  She started having contractions yesterday and came in for evaluation.  She had subjective fever the whole week with T-max 102 at home with muscle cramping and bouts of loose BM, no N/V, no abd pain.  Patient's was admitted on 03/31/2019 with discharge home on 04/01/2019. She states that she felt fine at the time of discharge then started to feel exhausted with fever and muscle aches within a couple hours of her arrival home on 04/01/2019. She states that her husband and all of their children are COVID positive and quarantining at home. This morning while in active contracting, around 9:30, the patient  started to desat, initially was on 2 L, then desaturated to 80s,  eventually her oxygenation was stabilized on 6 L, saturation 96%.  X-ray x-ray showed bilateral infiltrates. She denies any chest pain, no loss of taste, no abdominal pain, no leg pain.  Review of Systems:  Ten points of ROS done negative except mentioned above  Past Medical History:  Diagnosis Date  . Asthma   . Cervix dysplasia    Past Surgical History:  Procedure Laterality Date  . LEEP     Social History:  reports that she has never smoked. She has never used smokeless tobacco. She reports that she does not drink alcohol or use drugs.  No Known Allergies Family History  Problem Relation Age of Onset  . Diabetes Mother   . Hypertension Sister   . Cancer Sister     Prior to Admission medications   Medication Sig Start Date End Date Taking? Authorizing Provider  acetaminophen (TYLENOL) 500 MG tablet Take 500 mg by mouth every 6 (six) hours as needed.   Yes [provider]  aspirin EC 81 MG tablet Take 1 tablet (81 mg total) by mouth daily. 11/03/18  Yes Aletha Halim, MD  cyclobenzaprine (FLEXERIL) 10 MG tablet Take 1 tablet (10 mg total) by mouth 3 (three) times daily as needed for muscle spasms. 04/01/19  Yes Donnamae Jude, MD  Prenatal Vit-Fe Fumarate-FA (PRENATAL MULTIVITAMIN) TABS tablet Take 1 tablet by mouth daily at 12 noon.   Yes [provider]   Physical Exam: Blood pressure 105/77, pulse 98, temperature (!) 101.9 F (38.8 C), temperature source Oral, resp. rate 18, height 5\' 1"  (  1.549 m), weight 79 kg, last menstrual period 07/18/2018, SpO2 100 %, unknown if currently breastfeeding. Vitals:   04/06/19 1310 04/06/19 1315  BP:  105/77  Pulse:  98  Resp:  18  Temp:    SpO2: 100% 100%     General: Moderate respiratory distress  Eyes: No jaundice  ENT: No rash on throat  Neck: Supple no JVD  Cardiovascular: Sinus rhythm, tachycardia, no significant  murmurs.  Respiratory: Tachypneic, no crackles no wheezing.  Abdomen: Soft, nontender nondistended.  Skin: No significant rash  Musculoskeletal: No joint swelling  Psychiatric: Nervous  Neurologic: No focal deficit  Labs on Admission:  Basic Metabolic Panel: Recent Labs  Lab 03/31/19 0013 03/31/19 0621 03/31/19 0633 04/05/19 0636  NA 133* 135 137 134*  K 3.3* 3.3* 3.4* 3.1*  CL 107 109 106 106  CO2 17* 17*  --  16*  GLUCOSE 107* 125* 120* 91  BUN 6 5* 4* 14  CREATININE 0.57 0.51 0.40* 0.66  CALCIUM 8.3* 8.3*  --  8.3*   Liver Function Tests: Recent Labs  Lab 03/31/19 0013 03/31/19 0621 04/05/19 0636  AST 21 18 25   ALT 13 14 14   ALKPHOS 113 106 121  BILITOT 0.5 0.5 0.5  PROT 6.1* 5.9* 6.4*  ALBUMIN 2.6* 2.6* 2.4*   No results for input(s): LIPASE, AMYLASE in the last 168 hours. No results for input(s): AMMONIA in the last 168 hours. CBC: Recent Labs  Lab 03/31/19 0013 03/31/19 0621 03/31/19 0633 04/01/19 0645 04/05/19 0636  WBC 7.9 8.6  --  7.8 8.7  NEUTROABS 5.6  --   --  6.1 6.6  HGB 11.2* 10.6* 10.9* 10.5* 11.9*  HCT 33.2* 32.8* 32.0* 32.2* 35.4*  MCV 80.8 83.2  --  83.6 80.3  PLT 346 322  --  357 269   Cardiac Enzymes: No results for input(s): CKTOTAL, CKMB, CKMBINDEX, TROPONINI in the last 168 hours. BNP: Invalid input(s): POCBNP CBG: No results for input(s): GLUCAP in the last 168 hours.  Radiological Exams on Admission: DG CHEST PORT 1 VIEW  Result Date: 04/06/2019 CLINICAL DATA:  Hypoxia and low O2 saturations. EXAM: PORTABLE CHEST 1 VIEW COMPARISON:  04/05/2019 FINDINGS: 1224 hours. Low volume film. Cardiopericardial silhouette is at upper limits of normal for size. Increased hazy interstitial opacity lung with vascular crowding noted and interstitial pulmonary edema not excluded. There is no overt airspace pulmonary edema, pneumothorax, focal airspace consolidation, or pleural effusion. Catheter tubing overlying the left chest likely  from epidural. The visualized bony structures of the thorax are intact. IMPRESSION: Low volume film with upper normal heart size and possible interstitial pulmonary edema. Electronically Signed   By: 04/08/2019 M.D.   On: 04/06/2019 13:08   DG Chest Port 1 View  Result Date: 04/05/2019 CLINICAL DATA:  Pneumonia.  COVID. EXAM: PORTABLE CHEST 1 VIEW COMPARISON:  Five days ago FINDINGS: Low volume chest with suspected subtle infiltrates about the right hilum and peripheral left base. No edema, effusion, or pneumothorax. Normal heart size and mediastinal contours. IMPRESSION: Low volume chest with concern for early bilateral infiltrates. Electronically Signed   By: 04/08/2019 M.D.   On: 04/05/2019 07:32    EKG: Independently reviewed.   Time spent: 45  04/07/2019 [facility] Pager [phone]  If 7PM-7AM, please contact night-coverage [email or web address] Password [email or web address] 04/06/2019, 1:31 PM

## 2019-04-06 NOTE — Progress Notes (Addendum)
Labor Progress Note Leah Fernandez is a 44 y.o. G6P5005 at [redacted]w[redacted]d presented for IOL d/t NRFHT, COVID-19 (+) S: patient is having rigors, epidural working well. Baseline HR on baby increased from 130s to 150s, improved variability now.  O:  BP (!) 125/57   Pulse 100   Temp 98.4 F (36.9 C) (Oral)   Resp 18   Ht 5\' 1"  (1.549 m)   Wt 79 kg   LMP 07/18/2018 (Exact Date)   SpO2 94%   BMI 32.91 kg/m  EFM: 150 bpm/-accels/+variables v lates with quick recovery  CVE: Dilation: 5 Effacement (%): 90 Cervical Position: Posterior Station: -2 Presentation: Vertex Exam by:: Dr. [email or web address]   A&P: 44 y.o. 45 [redacted]w[redacted]d here for IOL d/t NRFHT, COVID-19 (+) #Labor: Progressing well. AROM for light meconium and IUPC placed now. Consented for PP IUD. #Pain: epidural #FWB: Cat II #GBS negative  [redacted]w[redacted]d, MD 9:32 AM  GME ATTESTATION:  I saw and evaluated the patient. I agree with the findings and the plan of care as documented in the resident's note, with the addition of the following.  Positive scalp stim. AROM with meconium-stained fluid. Due to deep variables, amnioinfusion started as well. Post placental IUD consented and ordered.   Symptomatic COVID19 with cough and rigors. No signs of chorio.  NICU made aware of COVID+ mom and possible call for delivery 2/2 meconium-stained fluid and Cat II tracing.  Shirlean Mylar, DO OB Fellow, Faculty [facility], [facility] for [facility] 04/06/2019 9:39 AM

## 2019-04-06 NOTE — Progress Notes (Signed)
MEDICATION RELATED CONSULT NOTE - INITIAL   Pharmacy Consult for Remdesivir   Indication: Covid 19  No Known Allergies  Patient Measurements: Height: 5\' 1"  (154.9 cm) Weight: 174 lb 2.6 oz (79 kg) IBW/kg (Calculated) : 47.8  Vital Signs: Temp: 101.9 F (38.8 C) (01/27 1231) Temp Source: Oral (01/27 1231) BP: 109/56 (01/27 1246) Pulse Rate: 107 (01/27 1246) Intake/Output from previous day: 01/26 0701 - 01/27 0700 In: 8546.2 [P.O.:1490; I.V.:2139.8; IV Piggyback:1049.4] Out: 2450 [Urine:2450] Intake/Output from this shift: Total I/O In: -  Out: 1010 [Urine:900; Blood:110]  Labs: Recent Labs    04/05/19 0636  WBC 8.7  HGB 11.9*  HCT 35.4*  PLT 269  CREATININE 0.66  ALBUMIN 2.4*  PROT 6.4*  AST 25  ALT 14  ALKPHOS 121  BILITOT 0.5   Estimated Creatinine Clearance: 87.2 mL/min (by C-G formula based on SCr of 0.66 mg/dL).   Microbiology: Recent Results (from the past 720 hour(s))  Culture, beta strep (group b only)     Status: None   Collection Time: 03/28/19  4:19 PM   Specimen: Vaginal/Rectal; Genital   VR  Result Value Ref Range Status   Strep Gp B Culture Negative Negative Final    Comment: Centers for Disease Control and Prevention (CDC) and American Congress of Obstetricians and Gynecologists (ACOG) guidelines for prevention of perinatal group B streptococcal (GBS) disease specify co-collection of a vaginal and rectal swab specimen to maximize sensitivity of GBS detection. Per the CDC and ACOG, swabbing both the lower vagina and rectum substantially increases the yield of detection compared with sampling the vagina alone. Penicillin G, ampicillin, or cefazolin are indicated for intrapartum prophylaxis of perinatal GBS colonization. Reflex susceptibility testing should be performed prior to use of clindamycin only on GBS isolates from penicillin-allergic women who are considered a high risk for anaphylaxis. Treatment with vancomycin without additional  testing is warranted if resistance to clindamycin is noted.   OB Urine Culture     Status: Abnormal   Collection Time: 03/31/19  6:20 AM   Specimen: Urine, Random  Result Value Ref Range Status   Specimen Description URINE, RANDOM  Final   Special Requests NONE  Final   Culture (A)  Final    MULTIPLE SPECIES PRESENT, SUGGEST RECOLLECTION NO GROUP B STREP (S.AGALACTIAE) ISOLATED Performed at [facility], [address] [place], [place] [address]    Report Status 04/01/2019 FINAL  Final    Medical History: Past Medical History:  Diagnosis Date  . Asthma   . Cervix dysplasia     Medications:  Medications Prior to Admission  Medication Sig Dispense Refill Last Dose  . acetaminophen (TYLENOL) 500 MG tablet Take 500 mg by mouth every 6 (six) hours as needed.   04/05/2019 at Unknown time  . aspirin EC 81 MG tablet Take 1 tablet (81 mg total) by mouth daily. 60 tablet 2 Past Week at Unknown time  . cyclobenzaprine (FLEXERIL) 10 MG tablet Take 1 tablet (10 mg total) by mouth 3 (three) times daily as needed for muscle spasms. 30 tablet 0 04/05/2019 at Unknown time  . Prenatal Vit-Fe Fumarate-FA (PRENATAL MULTIVITAMIN) TABS tablet Take 1 tablet by mouth daily at 12 noon.   04/04/2019 at Unknown time   Scheduled:  . acetaminophen  1,000 mg Oral Once  . dexamethasone  6 mg Oral Daily  . docusate sodium  100 mg Oral Daily  . [START ON 04/07/2019] pneumococcal 23 valent vaccine  0.5 mL Intramuscular Tomorrow-1000  .  prenatal multivitamin  1 tablet Oral Q1200   Infusions:  . fentaNYL 2 mcg/mL w/bupivacaine 0.125% in NS 250 mL Stopped (04/06/19 1115)  . lactated ringers with kcl 125 mL/hr at 04/05/19 2100  . lactated ringers    . lactated ringers    . lactated ringers Stopped (04/05/19 1120)  . oxytocin 8 milli-units/min (04/06/19 0645)  . remdesivir 200 mg in sodium chloride 0.9% 250 mL IVPB     Followed by  . [START ON 04/07/2019] remdesivir 100 mg in NS 100 mL     PRN:  acetaminophen, calcium carbonate, cyclobenzaprine, diphenhydrAMINE, ePHEDrine, ePHEDrine, fentaNYL (SUBLIMAZE) injection, fentaNYL 2 mcg/mL w/bupivacaine 0.125% in NS 250 mL, phenylephrine, phenylephrine, terbutaline, zolpidem  Assessment: 44 y.o. [id number] at [redacted]w[redacted]d  admitted for IOL for NRFHT in setting of symptomatic COVID. Pt is currently on dexamethasone 6mg  IV daily.  Pt's LFT WNL  Plan:  Remdesivir 200mg  IV x 1 followed by Remdesivir 100mg  IV q 24hr x 4 doses.  04/06/2019,12:58 PM

## 2019-04-06 NOTE — Procedures (Signed)
  Post-Placental IUD Insertion Procedure Note  Patient identified, informed consent signed prior to delivery, signed copy in chart, time out was performed.    Vaginal, labial and perineal areas thoroughly inspected for lacerations. Perineal scrape  -hemostatic, not repaired prior to insertion of Liletta IUD.  - IUD grasped with sterile ring forceps and string trimmed to 11 cm. Fundus identified through abdominal wall using non-insertion hand. IUD inserted to fundus with ring forceps. IUD carefully released at the fundus and ring forceps gently removed from vagina.     Patient tolerated procedure well.  Lot # [id number] Expiration Date: 07/2022  Patient given post procedure instructions and IUD care card with expiration date.  Patient is asked to keep IUD strings tucked in her vagina until her postpartum follow up visit in 4-6 weeks. Patient advised to abstain from sexual intercourse and pulling on strings before her follow-up visit. Patient verbalized an understanding of the plan of care and agrees.   Marlowe Alt, DO OB Fellow, Faculty Practice 04/06/2019 11:15 AM

## 2019-04-06 NOTE — Discharge Summary (Signed)
Postpartum Discharge Summary     Patient Name: Leah Fernandez DOB: 09-06-1975 MRN: [id number]  Date of admission: 04/05/2019 Delivering Provider: Merilyn Baba   Date of discharge: 04/08/2019  Admitting diagnosis: Antepartum non-reassuring fetal heart rate or rhythm affecting care of mother [O36.8390] Intrauterine pregnancy: [redacted]w[redacted]d    Secondary diagnosis:  Active Problems:   Lab test positive for detection of COVID-19 virus   Antepartum non-reassuring fetal heart rate or rhythm affecting care of mother   Hypoxia   Productive cough  Additional problems: IOL 2/2 NRFHR, symptomatic COVID19+, [place] multiparity, Advanced Maternal Age     Discharge diagnosis: Term Pregnancy Delivered                                                                                                Post partum procedures:  post placental IUD  Augmentation: AROM, Pitocin and Foley Balloon  Complications: None  Hospital course:  Induction of Labor With Vaginal Delivery   44y.o. yo [id number] 43w3das admitted to the hospital 04/05/2019 for induction of labor.  Indication for induction:  NRFHR, Cat II with multiple prolonged decels .  Patient had an uncomplicated labor course as follows:  She was admitted for IOL 2/2 Cat II tracing with multiple prolonged decelerations. Her induction was started with FB and pitocin. She was AROMed and progressed to complete, delivering shortly after.  Membrane Rupture Time/Date: 9:23 AM ,04/06/2019   Intrapartum Procedures: Episiotomy: None [1]                                         Lacerations:  None [1]  Patient had delivery of a Viable infant.  Information for the patient's newborn:  GaRobbin, Loughmiller0[[id number]]Delivery Method: Vag-Spont    04/06/2019  Details of delivery can be found in separate delivery note.  A post-placental IUD was placed (see procedure note for details).   During delivery, patient dropped her O2 sats into the 80s and she was  placed on 2L O2 via nasal cannula. Hospitalist was consulted and she was started on COVID treatment (remdesivir, zinc, vitamin C- was already started on decadron) and subsequently transferred to PCU. On 1/28, patient no longer requiring O2 and transferred back to [facility]Baby. Remdesivir was initiated and patient received 3 doses in the hospital. Received 4 doses of Dexamethasone which was continued for 6 more days on discharge. She desires interval BTL and this will be scheduled. PP IUD placed for interval birth control and office to contact patient to schedule BTL as she has already signed paperwork. Patient verbalized understanding of discharge and follow-up instructions and was ambulating without assistance upon discharge.  Patient is discharged home 04/08/19. Delivery time: 10:50 AM    Magnesium Sulfate received: No BMZ received: No Rhophylac:N/A MMR:N/A Transfusion:No  Physical exam  Vitals:   04/07/19 1238 04/07/19 2100 04/08/19 0536 04/08/19 0540  BP: 100/61 101/63 (!) 91/55   Pulse: 94 64 70   Resp: (!) 22 18  20   Temp: 97.6 F (36.4 C) (!) 97.4 F (36.3 C) (!) 97.3 F (36.3 C) 97.6 F (36.4 C)  TempSrc: Oral Oral Oral Oral  SpO2: 93% 97% 100%   Weight:      Height:       General: alert, cooperative and no distress Lochia: appropriate Uterine Fundus: firm Incision: N/A DVT Evaluation: No evidence of DVT seen on physical exam. Labs: Lab Results  Component Value Date   WBC 11.9 (H) 04/07/2019   HGB 11.0 (L) 04/07/2019   HCT 32.9 (L) 04/07/2019   MCV 82.0 04/07/2019   PLT 305 04/07/2019   CMP Latest Ref Rng & Units 04/07/2019  Glucose 70 - 99 mg/dL 72  BUN 6 - 20 mg/dL 6  Creatinine 0.44 - 1.00 mg/dL 0.58  Sodium 135 - 145 mmol/L 138  Potassium 3.5 - 5.1 mmol/L 3.8  Chloride 98 - 111 mmol/L 108  CO2 22 - 32 mmol/L 23  Calcium 8.9 - 10.3 mg/dL 7.7(L)  Total Protein 6.5 - 8.1 g/dL 5.0(L)  Total Bilirubin 0.3 - 1.2 mg/dL 0.1(L)  Alkaline Phos 38 - 126 U/L 88   AST 15 - 41 U/L 20  ALT 0 - 44 U/L 12    Discharge instruction: per After Visit Summary and "Baby and Me Booklet".  After visit meds:  Allergies as of 04/08/2019   No Known Allergies     Medication List    STOP taking these medications   aspirin EC 81 MG tablet     TAKE these medications   acetaminophen 500 MG tablet Commonly known as: TYLENOL Take 1 tablet (500 mg total) by mouth every 6 (six) hours as needed.   cyclobenzaprine 10 MG tablet Commonly known as: FLEXERIL Take 1 tablet (10 mg total) by mouth 3 (three) times daily as needed for muscle spasms.   dexamethasone 6 MG tablet Commonly known as: DECADRON Take 1 tablet (6 mg total) by mouth daily for 6 days. Start taking on: April 09, 2019   prenatal multivitamin Tabs tablet Take 1 tablet by mouth daily at 12 noon.       Diet: routine diet  Activity: Advance as tolerated. Pelvic rest for 6 weeks.   Outpatient follow up:4 weeks Follow up Appt: Future Appointments  Date Time Provider [place]  05/04/2019  3:55 PM Anyanwu, Sallyanne Havers, MD [facility]   Follow up Visit: Please schedule this patient for Postpartum visit in: 4 weeks with the following provider: Any provider For C/S patients schedule nurse incision check in weeks 2 weeks: no High risk pregnancy complicated by: [place] multiparity, AMA, H/o LEEP, symptomatic COVID+ Delivery mode:  SVD Anticipated Birth Control:  post placental IUD place, Plans Interval BTL PP Procedures needed: string check  Schedule Integrated BH visit: no   Newborn Data: Live born female  Birth Weight: 2892g  APGAR: 54, 9  Newborn Delivery   Birth date/time: 04/06/2019 10:50:00 Delivery type: Vaginal, Spontaneous      Baby Feeding: Breast Disposition:home with mother   04/08/2019 Chauncey Mann, MD

## 2019-04-06 NOTE — Anesthesia Preprocedure Evaluation (Signed)
Anesthesia Evaluation  Patient identified by MRN, date of birth, ID band Patient awake    Reviewed: Allergy & Precautions, NPO status , Patient's Chart, lab work & pertinent test results  Airway Mallampati: II  TM Distance: >3 FB Neck ROM: Full    Dental no notable dental hx.    Pulmonary asthma ,  COVID positive, symptoms include fever and cough   Pulmonary exam normal breath sounds clear to auscultation       Cardiovascular negative cardio ROS Normal cardiovascular exam Rhythm:Regular Rate:Normal     Neuro/Psych negative neurological ROS  negative psych ROS   GI/Hepatic negative GI ROS, Neg liver ROS,   Endo/Other  negative endocrine ROS  Renal/GU negative Renal ROS  negative genitourinary   Musculoskeletal negative musculoskeletal ROS (+)   Abdominal   Peds  Hematology negative hematology ROS (+)   Anesthesia Other Findings   Reproductive/Obstetrics (+) Pregnancy                             Anesthesia Physical Anesthesia Plan  ASA: III  Anesthesia Plan: Epidural   Post-op Pain Management:    Induction:   PONV Risk Score and Plan: Treatment may vary due to age or medical condition  Airway Management Planned: Natural Airway  Additional Equipment:   Intra-op Plan:   Post-operative Plan:   Informed Consent: I have reviewed the patients History and Physical, chart, labs and discussed the procedure including the risks, benefits and alternatives for the proposed anesthesia with the patient or authorized representative who has indicated his/her understanding and acceptance.       Plan Discussed with: Anesthesiologist  Anesthesia Plan Comments: (Patient identified. Risks, benefits, options discussed with patient including but not limited to bleeding, infection, nerve damage, paralysis, failed block, incomplete pain control, headache, blood pressure changes, nausea, vomiting,  reactions to medication, itching, and post partum back pain. Confirmed with bedside nurse the patient's most recent platelet count. Confirmed with the patient that they are not taking any anticoagulation, have any bleeding history or any family history of bleeding disorders. Patient expressed understanding and wishes to proceed. All questions were answered. )        Anesthesia Quick Evaluation

## 2019-04-06 NOTE — Anesthesia Postprocedure Evaluation (Signed)
Anesthesia Post Note  Patient: Leah Fernandez  Procedure(s) Performed: AN AD HOC LABOR EPIDURAL     Patient location during evaluation: Mother Baby Anesthesia Type: Epidural Level of consciousness: awake and alert Pain management: pain level controlled Vital Signs Assessment: post-procedure vital signs reviewed and stable Respiratory status: spontaneous breathing, nonlabored ventilation and respiratory function stable Cardiovascular status: stable Postop Assessment: no headache, no backache and epidural receding Anesthetic complications: no    Last Vitals:  Vitals:   04/06/19 1800 04/06/19 1820  BP: 101/61   Pulse: 72 76  Resp: (!) 21 18  Temp: 36.5 C   SpO2: 99% 98%    Last Pain:  Vitals:   04/06/19 1800  TempSrc: Oral  PainSc: 0-No pain   Pain Goal: Patients Stated Pain Goal: 0 (04/05/19 1015)                 Katerra Ingman L Analiah Drum

## 2019-04-06 NOTE — Progress Notes (Signed)
Patient trasfered from [facility] to [phone] via wheelchair; alert and oriented x 4; no complaints of pain; IV saline locked in R arm; skin intact. Orient patient to room and unit; gave patient care guide; instructed how to use the call bell and  fall risk precautions. Sat O2 98% on 3 L. Will continue to monitor the patient.

## 2019-04-06 NOTE — Anesthesia Procedure Notes (Signed)
Epidural Patient location during procedure: OB Start time: 04/06/2019 6:10 AM End time: 04/06/2019 6:25 AM  Staffing Anesthesiologist: Elmer Picker, MD Performed: anesthesiologist   Preanesthetic Checklist Completed: patient identified, IV checked, risks and benefits discussed, monitors and equipment checked, pre-op evaluation and timeout performed  Epidural Patient position: sitting Prep: DuraPrep and site prepped and draped Patient monitoring: continuous pulse ox, blood pressure, heart rate and cardiac monitor Approach: midline Location: L3-L4 Injection technique: LOR air  Needle:  Needle type: Tuohy  Needle gauge: 17 G Needle length: 9 cm Needle insertion depth: 6 cm Catheter type: closed end flexible Catheter size: 19 Gauge Catheter at skin depth: 12 cm Test dose: negative  Assessment Sensory level: T8 Events: blood not aspirated, injection not painful, no injection resistance, no paresthesia and negative IV test  Additional Notes Patient identified. Risks/Benefits/Options discussed with patient including but not limited to bleeding, infection, nerve damage, paralysis, failed block, incomplete pain control, headache, blood pressure changes, nausea, vomiting, reactions to medication both or allergic, itching and postpartum back pain. Confirmed with bedside nurse the patient's most recent platelet count. Confirmed with patient that they are not currently taking any anticoagulation, have any bleeding history or any family history of bleeding disorders. Patient expressed understanding and wished to proceed. All questions were answered. Sterile technique was used throughout the entire procedure. Please see nursing notes for vital signs. Test dose was given through epidural catheter and negative prior to continuing to dose epidural or start infusion. Warning signs of high block given to the patient including shortness of breath, tingling/numbness in hands, complete motor block,  or any concerning symptoms with instructions to call for help. Patient was given instructions on fall risk and not to get out of bed. All questions and concerns addressed with instructions to call with any issues or inadequate analgesia.  Reason for block:procedure for pain

## 2019-04-06 NOTE — Plan of Care (Signed)
A.Krishay Faro, RN 

## 2019-04-06 NOTE — Progress Notes (Signed)
Due to patient being on 6L [place], and still satting 96%, recommendation was made to transfer patient to PCU for monitoring given her rapid decline in oxygenation prior to delivery.  Dr. Chipper Herb accepting, Dr. Debroah Loop aware.  Patient may be able to be transferred to Mother Baby if her respiratory status improves.  Marlowe Alt, DO OB Fellow, [facility] 04/06/2019 2:20 PM

## 2019-04-06 NOTE — Progress Notes (Signed)
Pt to be transferred to 5 west per hospitalist request.  I spoke to Salena Saner, RN  from [facility] speciality care she states that [facility] charge RN will do postpartum assessments on patient while on 5 west. She will also bring breast pump for pt to begin pumping. Marquis Buggy, RN will be the charge RN for [facility] tonight and will come to do her postpartum assessments for the night shift. Father of baby and baby will be in the care of Mother Baby 4 in room 38.  Chriss Czar, RN will receive them upon transfer.

## 2019-04-06 NOTE — Progress Notes (Signed)
LABOR PROGRESS NOTE  Leah Fernandez is a 44 y.o. G6P5005 at [redacted]w[redacted]d  admitted for IOL for NRFHT in setting of symptomatic COVID.  Subjective: Denies SOB or chest pain Reports all prior vaginal deliveries with spontaneous labors, no inductions No problems with shoulders or bleeding at prior deliveries Still desires BTL, signed papers on 03/28/2019  Objective: BP 109/60   Pulse 94   Temp 97.8 F (36.6 C) (Oral)   Resp 18   Ht 5\' 1"  (1.549 m)   Wt 79 kg   LMP 07/18/2018 (Exact Date)   SpO2 99%   BMI 32.91 kg/m  or  Vitals:   04/05/19 2357 04/06/19 0000 04/06/19 0102 04/06/19 0203  BP: 107/60  112/70 109/60  Pulse: 89  89 94  Resp: 20   18  Temp: 97.8 F (36.6 C)     TempSrc: Oral     SpO2: 99% 99% 99%   Weight:      Height:         Dilation: 1 Effacement (%): 50 Cervical Position: Posterior Exam by:: [email or web address] NP FHT: baseline rate 135, moderate varibility, -acel, -decel Toco: no ctx  Labs: Lab Results  Component Value Date   WBC 8.7 04/05/2019   HGB 11.9 (L) 04/05/2019   HCT 35.4 (L) 04/05/2019   MCV 80.3 04/05/2019   PLT 269 04/05/2019    Patient Active Problem List   Diagnosis Date Noted  . Antepartum non-reassuring fetal heart rate or rhythm affecting care of mother 04/05/2019  . COVID-19 affecting pregnancy in third trimester 03/31/2019  . Grand multiparity 11/04/2018  . AMA (advanced maternal age) multigravida 35+ 11/03/2018  . Supervision of high risk pregnancy, antepartum 11/03/2018  . History of LEEP (loop electrosurgical excision procedure) of cervix complicating pregnancy 11/03/2018  . Language barrier 11/03/2018  . CIN II (cervical intraepithelial neoplasia II) 07/29/2013    Assessment / Plan: 44 y.o. G6P5005 at [redacted]w[redacted]d here for IOL for NRFHT in setting of symptomatic COVID infection.  Labor: Admitted yesterday after noted to have several prolonged decelerations while being evaluated in MAU for symptoms of COVID. Persisted while in [facility],  transferred to L&D for IOL.  Cervix favorable at 1/90/-2, FB placed @0230 , will start pitocin. Once FB is out plan to place epidural and AROM. Fetal Wellbeing:  Cat I currently Pain Control:  IV pain meds PRN, planning on epidural GBS: negative Anticipated MOD:  SVD  COVID: positive 03/31/2019 with symptoms of fever, cough at that time. Currently reports no SOB or chest pain. Cont airborn/contact precautions, dexamethasone 6mg  daily x10d.  , MD/MPH OB Fellow  04/06/2019, 3:03 AM

## 2019-04-07 ENCOUNTER — Inpatient Hospital Stay (HOSPITAL_COMMUNITY): Payer: Medicaid Other

## 2019-04-07 DIAGNOSIS — R05 Cough: Secondary | ICD-10-CM

## 2019-04-07 DIAGNOSIS — R058 Other specified cough: Secondary | ICD-10-CM

## 2019-04-07 DIAGNOSIS — U071 COVID-19: Secondary | ICD-10-CM

## 2019-04-07 LAB — CBC WITH DIFFERENTIAL/PLATELET
Abs Immature Granulocytes: 0.08 10*3/uL — ABNORMAL HIGH (ref 0.00–0.07)
Basophils Absolute: 0 10*3/uL (ref 0.0–0.1)
Basophils Relative: 0 %
Eosinophils Absolute: 0 10*3/uL (ref 0.0–0.5)
Eosinophils Relative: 0 %
HCT: 32.9 % — ABNORMAL LOW (ref 36.0–46.0)
Hemoglobin: 11 g/dL — ABNORMAL LOW (ref 12.0–15.0)
Immature Granulocytes: 1 %
Lymphocytes Relative: 17 %
Lymphs Abs: 2.1 10*3/uL (ref 0.7–4.0)
MCH: 27.4 pg (ref 26.0–34.0)
MCHC: 33.4 g/dL (ref 30.0–36.0)
MCV: 82 fL (ref 80.0–100.0)
Monocytes Absolute: 0.5 10*3/uL (ref 0.1–1.0)
Monocytes Relative: 5 %
Neutro Abs: 9.2 10*3/uL — ABNORMAL HIGH (ref 1.7–7.7)
Neutrophils Relative %: 77 %
Platelets: 305 10*3/uL (ref 150–400)
RBC: 4.01 MIL/uL (ref 3.87–5.11)
RDW: 14.2 % (ref 11.5–15.5)
WBC: 11.9 10*3/uL — ABNORMAL HIGH (ref 4.0–10.5)
nRBC: 0 % (ref 0.0–0.2)

## 2019-04-07 LAB — COMPREHENSIVE METABOLIC PANEL
ALT: 12 U/L (ref 0–44)
AST: 20 U/L (ref 15–41)
Albumin: 1.9 g/dL — ABNORMAL LOW (ref 3.5–5.0)
Alkaline Phosphatase: 88 U/L (ref 38–126)
Anion gap: 7 (ref 5–15)
BUN: 6 mg/dL (ref 6–20)
CO2: 23 mmol/L (ref 22–32)
Calcium: 7.7 mg/dL — ABNORMAL LOW (ref 8.9–10.3)
Chloride: 108 mmol/L (ref 98–111)
Creatinine, Ser: 0.58 mg/dL (ref 0.44–1.00)
GFR calc Af Amer: >60 mL/min (ref >60)
GFR calc non Af Amer: >60 mL/min (ref >60)
Glucose, Bld: 72 mg/dL (ref 70–99)
Potassium: 3.8 mmol/L (ref 3.5–5.1)
Sodium: 138 mmol/L (ref 135–145)
Total Bilirubin: 0.1 mg/dL — ABNORMAL LOW (ref 0.3–1.2)
Total Protein: 5 g/dL — ABNORMAL LOW (ref 6.5–8.1)

## 2019-04-07 LAB — FERRITIN: Ferritin: 83 ng/mL (ref 11–307)

## 2019-04-07 LAB — PHOSPHORUS: Phosphorus: 3.5 mg/dL (ref 2.5–4.6)

## 2019-04-07 LAB — GLUCOSE, CAPILLARY
Glucose-Capillary: 104 mg/dL — ABNORMAL HIGH (ref 70–99)
Glucose-Capillary: 75 mg/dL (ref 70–99)
Glucose-Capillary: 91 mg/dL (ref 70–99)
Glucose-Capillary: 98 mg/dL (ref 70–99)

## 2019-04-07 LAB — C-REACTIVE PROTEIN: CRP: 8.4 mg/dL — ABNORMAL HIGH (ref <1.0)

## 2019-04-07 LAB — D-DIMER, QUANTITATIVE: D-Dimer, Quant: 1.53 ug/mL-FEU — ABNORMAL HIGH (ref 0.00–0.50)

## 2019-04-07 LAB — MAGNESIUM: Magnesium: 1.8 mg/dL (ref 1.7–2.4)

## 2019-04-07 MED ORDER — ENOXAPARIN SODIUM 40 MG/0.4ML ~~LOC~~ SOLN
40.0000 mg | SUBCUTANEOUS | Status: DC
  Administered 2019-04-07: 40 mg via SUBCUTANEOUS
  Filled 2019-04-07: qty 0.4

## 2019-04-07 NOTE — Lactation Note (Signed)
This note was copied from a baby's chart. Lactation Consultation Note RN stated mom is feeling very sick. Hasn't felt like pumping.  Patient Name: Leah Fernandez TDHRC'[id number] Date: 04/07/2019     Maternal Data    Feeding    LATCH Score                   Interventions    Lactation Tools Discussed/Used     Consult Status      [name], Diamond Nickel 04/07/2019, 1:19 AM

## 2019-04-07 NOTE — Progress Notes (Signed)
PROGRESS NOTE    Leah Fernandez  DEY:[id number] DOB: 1975-09-08 DOA: 04/05/2019 PCP: Osborne Oman, MD    Brief Narrative:44 y.o.femalewithout significant medical history who is 37W3Ds gestation and scented with the recurrent fever and dry cough muscle aching feeling tired for 10 days.  Patient was tested + COVID on January 20. She was treated with Betamethasone and Procardia. She started having contractions yesterday and came in for evaluation. She had subjective fever the whole week with T-max 102 at home with muscle cramping and bouts of loose BM, no N/V, no abd pain. Patient's was admitted on 03/31/2019 with discharge home on 04/01/2019. She states that she felt fine at the time of discharge then started to feel exhausted with fever and muscle aches within a couple hours of her arrival home on 04/01/2019. She states that her husband and all of their children are COVID positive and quarantining at home. This morning while in active contracting, around 9:30, the patient started to desat, initially was on 2 L, then desaturated to 80s, eventually her oxygenation was stabilized on 6 L, saturation 96%.  X-ray x-ray showed bilateral infiltrates. She denies any chest pain, no loss of taste, no abdominal pain, no leg pain.   Assessment & Plan:   Active Problems:   Antepartum non-reassuring fetal heart rate or rhythm affecting care of mother   Hypoxia    #1 acute hypoxic respiratory failure secondary to COVID-19 pneumonia..  She is saturating on room air about 92%.  She is very anxious and wants to be with her baby.  Continue breathing treatments. Finish the course of remdesivir. Continue Decadron for a total of 10 days. Okay from medical standpoint to transfer patient back to mother and baby.  Discussed with the nursing staff. Lovenox while in hospital if okay with primary.  Estimated body mass index is 32.91 kg/m as calculated from the following:   Height as of this encounter:  5\' 1"  (1.549 m).   Weight as of this encounter: 79 kg.  DVT prophylaxis: None  Code Status: Full code  family Communication: None  disposition Plan: Per primary  Subjective: Resting in bed on room air anxious to see the baby feels her breathing is better cough is better no nausea vomiting  Objective: Vitals:   04/07/19 0208 04/07/19 0400 04/07/19 0745 04/07/19 0805  BP:  107/71  106/73  Pulse:  89  99  Resp:  (!) 21  20  Temp: 98.8 F (37.1 C) 99.2 F (37.3 C)  99 F (37.2 C)  TempSrc: Oral Oral  Oral  SpO2:  97% 95% 92%  Weight:      Height:        Intake/Output Summary (Last 24 hours) at 04/07/2019 1210 Last data filed at 04/07/2019 0900 Gross per 24 hour  Intake 300 ml  Output 3000 ml  Net -2700 ml   Filed Weights   04/05/19 0953  Weight: 79 kg    Examination:  General exam: Appears calm and comfortable  Respiratory system: Few scattered rhonchi otherwise clear to auscultation. Respiratory effort normal. Cardiovascular system: S1 & S2 heard, RRR. No JVD, murmurs, rubs, gallops or clicks. No pedal edema. Gastrointestinal system: Abdomen is nondistended, soft and nontender. No organomegaly or masses felt. Normal bowel sounds heard. Central nervous system: Alert and oriented. No focal neurological deficits. Extremities: Symmetric 5 x 5 power. Skin: No rashes, lesions or ulcers Psychiatry: Judgement and insight appear normal. Mood & affect appropriate.     Data Reviewed: I  have personally reviewed following labs and imaging studies  CBC: Recent Labs  Lab 04/01/19 0645 04/05/19 0636 04/07/19 0410  WBC 7.8 8.7 11.9*  NEUTROABS 6.1 6.6 9.2*  HGB 10.5* 11.9* 11.0*  HCT 32.2* 35.4* 32.9*  MCV 83.6 80.3 82.0  PLT 357 269 305   Basic Metabolic Panel: Recent Labs  Lab 04/05/19 0636 04/07/19 0410  NA 134* 138  K 3.1* 3.8  CL 106 108  CO2 16* 23  GLUCOSE 91 72  BUN 14 6  CREATININE 0.66 0.58  CALCIUM 8.3* 7.7*  MG  --  1.8  PHOS  --  3.5    GFR: Estimated Creatinine Clearance: 87.2 mL/min (by C-G formula based on SCr of 0.58 mg/dL). Liver Function Tests: Recent Labs  Lab 04/05/19 0636 04/07/19 0410  AST 25 20  ALT 14 12  ALKPHOS 121 88  BILITOT 0.5 0.1*  PROT 6.4* 5.0*  ALBUMIN 2.4* 1.9*   No results for input(s): LIPASE, AMYLASE in the last 168 hours. No results for input(s): AMMONIA in the last 168 hours. Coagulation Profile: No results for input(s): INR, PROTIME in the last 168 hours. Cardiac Enzymes: No results for input(s): CKTOTAL, CKMB, CKMBINDEX, TROPONINI in the last 168 hours. BNP (last 3 results) No results for input(s): PROBNP in the last 8760 hours. HbA1C: No results for input(s): HGBA1C in the last 72 hours. CBG: Recent Labs  Lab 04/06/19 2116 04/07/19 0011 04/07/19 0446 04/07/19 0528 04/07/19 1149  GLUCAP 142* 104* 75 98 91   Lipid Profile: No results for input(s): CHOL, HDL, LDLCALC, TRIG, CHOLHDL, LDLDIRECT in the last 72 hours. Thyroid Function Tests: No results for input(s): TSH, T4TOTAL, FREET4, T3FREE, THYROIDAB in the last 72 hours. Anemia Panel: Recent Labs    04/05/19 0822 04/07/19 0410  FERRITIN 74 83   Sepsis Labs: Recent Labs  Lab 04/05/19 0822  PROCALCITON <0.10    Recent Results (from the past 240 hour(s))  Culture, beta strep (group b only)     Status: None   Collection Time: 03/28/19  4:19 PM   Specimen: Vaginal/Rectal; Genital   VR  Result Value Ref Range Status   Strep Gp B Culture Negative Negative Final    Comment: Centers for Disease Control and Prevention (CDC) and American Congress of Obstetricians and Gynecologists (ACOG) guidelines for prevention of perinatal group B streptococcal (GBS) disease specify co-collection of a vaginal and rectal swab specimen to maximize sensitivity of GBS detection. Per the CDC and ACOG, swabbing both the lower vagina and rectum substantially increases the yield of detection compared with sampling the vagina  alone. Penicillin G, ampicillin, or cefazolin are indicated for intrapartum prophylaxis of perinatal GBS colonization. Reflex susceptibility testing should be performed prior to use of clindamycin only on GBS isolates from penicillin-allergic women who are considered a high risk for anaphylaxis. Treatment with vancomycin without additional testing is warranted if resistance to clindamycin is noted.   OB Urine Culture     Status: Abnormal   Collection Time: 03/31/19  6:20 AM   Specimen: Urine, Random  Result Value Ref Range Status   Specimen Description URINE, RANDOM  Final   Special Requests NONE  Final   Culture (A)  Final    MULTIPLE SPECIES PRESENT, SUGGEST RECOLLECTION NO GROUP B STREP (S.AGALACTIAE) ISOLATED Performed at [facility], [address], [place], [place] [address]    Report Status 04/01/2019 FINAL  Final  MRSA PCR Screening     Status: None   Collection Time:  04/06/19  6:05 PM   Specimen: Nasopharyngeal  Result Value Ref Range Status   MRSA by PCR NEGATIVE NEGATIVE Final    Comment:        The GeneXpert MRSA Assay (FDA approved for NASAL specimens only), is one component of a comprehensive MRSA colonization surveillance program. It is not intended to diagnose MRSA infection nor to guide or monitor treatment for MRSA infections. Performed at [facility], [address], [place], [place] [address]          Radiology Studies: DG Chest Port 1 View  Result Date: 04/07/2019 CLINICAL DATA:  Hypoxia fever history of COVID positive. EXAM: PORTABLE CHEST 1 VIEW COMPARISON:  04/06/2019 FINDINGS: Low volume chest. Accentuation of cardiomediastinal contours similar to prior study. No signs of dense consolidation or evidence of pleural effusion. Patchy peripheral opacities noted particularly at the right lung base but also projecting over right anterior second rib. No acute bone process. IMPRESSION: Subtle peripheral opacities noted particularly at the  right lung base but also projecting over the right anterior second rib. Findings may be related to reported viral infection or atelectasis. No dense consolidation. Electronically Signed   By: Donzetta Kohut M.D.   On: 04/07/2019 08:13   DG CHEST PORT 1 VIEW  Result Date: 04/06/2019 CLINICAL DATA:  Hypoxia and low O2 saturations. EXAM: PORTABLE CHEST 1 VIEW COMPARISON:  04/05/2019 FINDINGS: 1224 hours. Low volume film. Cardiopericardial silhouette is at upper limits of normal for size. Increased hazy interstitial opacity lung with vascular crowding noted and interstitial pulmonary edema not excluded. There is no overt airspace pulmonary edema, pneumothorax, focal airspace consolidation, or pleural effusion. Catheter tubing overlying the left chest likely from epidural. The visualized bony structures of the thorax are intact. IMPRESSION: Low volume film with upper normal heart size and possible interstitial pulmonary edema. Electronically Signed   By: Kennith Center M.D.   On: 04/06/2019 13:08        Scheduled Meds: . vitamin C  500 mg Oral Daily  . dexamethasone  6 mg Oral Daily  . insulin aspart  0-9 Units Subcutaneous Q4H  . ipratropium  2 puff Inhalation Q6H  . pneumococcal 23 valent vaccine  0.5 mL Intramuscular Tomorrow-1000  . prenatal multivitamin  1 tablet Oral Q1200  . zinc sulfate  220 mg Oral Daily   Continuous Infusions: . remdesivir 100 mg in NS 100 mL       LOS: 2 days     Alwyn Ren, MD [facility]  If 7PM-7AM, please contact night-coverage [email or web address] Password [facility] 04/07/2019, 12:10 PM

## 2019-04-07 NOTE — Progress Notes (Signed)
SATURATION QUALIFICATIONS: (This note is used to comply with regulatory documentation for home oxygen)  Patient Saturations on Room Air at Rest = 93-95%  Patient Saturations on Room Air while Ambulating = 90-91%

## 2019-04-07 NOTE — Progress Notes (Signed)
POSTPARTUM PROGRESS NOTE  Post Partum Day 1  Subjective:  Leah Fernandez is a 44 y.o. G4W1027 s/p NSVD at [redacted]w[redacted]d.    Transferred to 5W after delivery for increasing O2 requirements.   She reports she is doing OK. No acute events overnight. She denies any problems with ambulating, voiding or po intake. Denies nausea or vomiting.  Pain is well controlled.  Lochia is like a period.  Reports still having cough and body aches.   Objective: Blood pressure 106/73, pulse 99, temperature 99.2 F (37.3 C), temperature source Oral, resp. rate 20, height 5\' 1"  (1.549 m), weight 79 kg, last menstrual period 07/18/2018, SpO2 92 %, unknown if currently breastfeeding.  Physical Exam:  General: alert, cooperative and no distress Chest: no respiratory distress Heart:regular rate, distal pulses intact Abdomen: soft, nontender,  Uterine Fundus: firm, appropriately tender DVT Evaluation: No calf swelling or tenderness Extremities: no LE edema Skin: warm, dry  Recent Labs    04/05/19 0636 04/07/19 0410  HGB 11.9* 11.0*  HCT 35.4* 32.9*    Assessment/Plan: Leah Fernandez is a 44 y.o. 45 s/p NSVD at [redacted]w[redacted]d and subsequent transfer to 5W for worsening respiratory status.   PPD#1 - Doing well from PP perspective  Routine postpartum care COVID: decreased O2 requirements 6L-->2L overnight, remains mildly tachypneic. Appreciate hospitalist consult, cont treatment per their recs (steroids, remdesivir, etc), may be candidate to return to [facility] soon.  Contraception: Post placental IUD placed after delivery Feeding: plans to breastfeed. Reviewed normal physiology, colostrum vs breast milk, reinforced importance of pumping q3h to ensure milk will come in.  Dispo: Plan for discharge pending respiratory status.   LOS: 2 days   [facility], MD/[facility] Fellow  04/07/2019, 8:37 AM

## 2019-04-07 NOTE — Progress Notes (Signed)
Patient concerned about being back with her newborn baby. Has been weaned from O2 @ is on RA and maintaining SPO2 93-95%. Messaged MD regarding plan to update patient.

## 2019-04-08 LAB — CBC WITH DIFFERENTIAL/PLATELET
Abs Immature Granulocytes: 0.14 10*3/uL — ABNORMAL HIGH (ref 0.00–0.07)
Basophils Absolute: 0 10*3/uL (ref 0.0–0.1)
Basophils Relative: 0 %
Eosinophils Absolute: 0 10*3/uL (ref 0.0–0.5)
Eosinophils Relative: 0 %
HCT: 33.4 % — ABNORMAL LOW (ref 36.0–46.0)
Hemoglobin: 11.1 g/dL — ABNORMAL LOW (ref 12.0–15.0)
Immature Granulocytes: 1 %
Lymphocytes Relative: 20 %
Lymphs Abs: 2.5 10*3/uL (ref 0.7–4.0)
MCH: 27.2 pg (ref 26.0–34.0)
MCHC: 33.2 g/dL (ref 30.0–36.0)
MCV: 81.9 fL (ref 80.0–100.0)
Monocytes Absolute: 0.7 10*3/uL (ref 0.1–1.0)
Monocytes Relative: 6 %
Neutro Abs: 8.7 10*3/uL — ABNORMAL HIGH (ref 1.7–7.7)
Neutrophils Relative %: 73 %
Platelets: 352 10*3/uL (ref 150–400)
RBC: 4.08 MIL/uL (ref 3.87–5.11)
RDW: 14.2 % (ref 11.5–15.5)
WBC: 12 10*3/uL — ABNORMAL HIGH (ref 4.0–10.5)
nRBC: 0.2 % (ref 0.0–0.2)

## 2019-04-08 LAB — COMPREHENSIVE METABOLIC PANEL
ALT: 14 U/L (ref 0–44)
AST: 19 U/L (ref 15–41)
Albumin: 2 g/dL — ABNORMAL LOW (ref 3.5–5.0)
Alkaline Phosphatase: 90 U/L (ref 38–126)
Anion gap: 7 (ref 5–15)
BUN: 9 mg/dL (ref 6–20)
CO2: 23 mmol/L (ref 22–32)
Calcium: 8.2 mg/dL — ABNORMAL LOW (ref 8.9–10.3)
Chloride: 109 mmol/L (ref 98–111)
Creatinine, Ser: 0.4 mg/dL — ABNORMAL LOW (ref 0.44–1.00)
GFR calc Af Amer: >60 mL/min (ref >60)
GFR calc non Af Amer: >60 mL/min (ref >60)
Glucose, Bld: 92 mg/dL (ref 70–99)
Potassium: 3.5 mmol/L (ref 3.5–5.1)
Sodium: 139 mmol/L (ref 135–145)
Total Bilirubin: <0.1 mg/dL — ABNORMAL LOW (ref 0.3–1.2)
Total Protein: 5.2 g/dL — ABNORMAL LOW (ref 6.5–8.1)

## 2019-04-08 LAB — C-REACTIVE PROTEIN: CRP: 7.4 mg/dL — ABNORMAL HIGH (ref <1.0)

## 2019-04-08 LAB — MAGNESIUM: Magnesium: 1.9 mg/dL (ref 1.7–2.4)

## 2019-04-08 LAB — SURGICAL PATHOLOGY

## 2019-04-08 LAB — FERRITIN: Ferritin: 90 ng/mL (ref 11–307)

## 2019-04-08 LAB — PHOSPHORUS: Phosphorus: 4.2 mg/dL (ref 2.5–4.6)

## 2019-04-08 LAB — D-DIMER, QUANTITATIVE: D-Dimer, Quant: 0.69 ug/mL-FEU — ABNORMAL HIGH (ref 0.00–0.50)

## 2019-04-08 MED ORDER — ACETAMINOPHEN 500 MG PO TABS: 500.0000 mg | ORAL_TABLET | Freq: Four times a day (QID) | ORAL | 0 refills | Status: AC | PRN

## 2019-04-08 MED ORDER — DEXAMETHASONE 6 MG PO TABS: 6.0000 mg | ORAL_TABLET | Freq: Every day | ORAL | 0 refills | Status: AC

## 2019-04-08 MED FILL — DEXAMETHASONE 6 MG TABLET: 6 | 6 days supply | Qty: 6 | Fill #0

## 2019-04-08 MED FILL — ACETAMINOPHEN 500MG XT STRE: 500 | 8 days supply | Qty: 30 | Fill #0

## 2019-04-08 NOTE — Care Management (Signed)
Pt deeded stable for discharge today.  CM reviewed chart for TOC needs - none determined.   Discharge order written - no outstanding TOC consults/orders - TOC signing off

## 2019-04-08 NOTE — Lactation Note (Signed)
This note was copied from a baby's chart. Lactation Consultation Note: Mother is a P6 and infant is at 8% wt loss. Mother is breast and formula feeding.  Staff nurse Devin reports that mother denies need to see or have questions for the [facility]. Staff nurse reports that she will review engorgement with mother.   Patient Name: Leah Fernandez [id number] Date: 04/08/2019     Maternal Data    Feeding Feeding Type: Breast Fed Nipple Type: Slow - flow  LATCH Score                   Interventions    Lactation Tools Discussed/Used     Consult Status      Leah Fernandez 04/08/2019, 12:11 PM

## 2019-04-08 NOTE — Progress Notes (Signed)
PROGRESS NOTE    Leah Fernandez  YJE:[id number] DOB: Aug 21, 1975 DOA: 04/05/2019 PCP: Osborne Oman, MD    Brief Narrative:43 y.o.femalewithout significant medical history who is 37W3Dsgestation and scented with the recurrent fever and dry cough muscle aching feeling tired for 10 days.Patient was tested+ COVID onJanuary 20. Shewastreated with Betamethasone and Procardia. She started having contractions yesterday and came in for evaluation. She had subjective feverthe whole weekwith T-max 102 at home with muscle cramping and bouts of loose BM, no N/V, no abd pain.Patient'swas admitted on01/21/2021 with discharge home on 04/01/2019. She states that she felt fine at the time of discharge then started to feel exhausted with fever and muscle aches within a couple hours of her arrival home on 04/01/2019. She states that her husband and all of their children are COVID positive and quarantining at home. This morning while in active contracting, around 9:30, the patient started to desat,initially was on 2 L, then desaturated to 80s,eventually her oxygenation was stabilized on 6 L,saturation 96%.X-ray x-ray showed bilateral infiltrates. She denies any chest pain, no loss of taste,no abdominal pain, no leg pain.   Assessment & Plan:   Active Problems:   Lab test positive for detection of COVID-19 virus   Antepartum non-reassuring fetal heart rate or rhythm affecting care of mother   Hypoxia   Productive cough   #1 acute hypoxic respiratory failure secondary to COVID-19 pneumonia.. Saturating on room air above 95%.  Will need to finish the course of remdesivir as an outpatient.  She is set up for outpatient infusion on Saturday and Sunday at 1130.  The instructions are in the AVS. Continue Decadron for a total of 10 days. Discussed with the bed  side nurse and Jena Gauss at [facility]    Estimated body mass index is 32.91 kg/m as calculated from the following:   Height  as of this encounter: 5\' 1"  (1.549 m).   Weight as of this encounter: 79 kg.   Subjective: Resting in bed awake alert holding baby no complaints of shortness of breath Objective: Vitals:   04/07/19 1238 04/07/19 2100 04/08/19 0536 04/08/19 0540  BP: 100/61 101/63 (!) 91/55   Pulse: 94 64 70   Resp: (!) 22 18 20    Temp: 97.6 F (36.4 C) (!) 97.4 F (36.3 C) (!) 97.3 F (36.3 C) 97.6 F (36.4 C)  TempSrc: Oral Oral Oral Oral  SpO2: 93% 97% 100%   Weight:      Height:       No intake or output data in the 24 hours ending 04/08/19 1200 Filed Weights   04/05/19 0953  Weight: 79 kg    Examination:  General exam: Appears calm and comfortable  Respiratory system: Clear to auscultation. Respiratory effort normal. Cardiovascular system: S1 & S2 heard, RRR. No JVD, murmurs, rubs, gallops or clicks. No pedal edema. Gastrointestinal system: Abdomen is nondistended, soft and nontender. No organomegaly or masses felt. Normal bowel sounds heard. Central nervous system: Alert and oriented. No focal neurological deficits. Extremities: Symmetric 5 x 5 power. Skin: No rashes, lesions or ulcers Psychiatry: Judgement and insight appear normal. Mood & affect appropriate.     Data Reviewed: I have personally reviewed following labs and imaging studies  CBC: Recent Labs  Lab 04/05/19 0636 04/07/19 0410 04/08/19 0653  WBC 8.7 11.9* 12.0*  NEUTROABS 6.6 9.2* 8.7*  HGB 11.9* 11.0* 11.1*  HCT 35.4* 32.9* 33.4*  MCV 80.3 82.0 81.9  PLT 269 305 637   Basic Metabolic  Panel: Recent Labs  Lab 04/05/19 0636 04/07/19 0410 04/08/19 0653  NA 134* 138 139  K 3.1* 3.8 3.5  CL 106 108 109  CO2 16* 23 23  GLUCOSE 91 72 92  BUN 14 6 9   CREATININE 0.66 0.58 0.40*  CALCIUM 8.3* 7.7* 8.2*  MG  --  1.8 1.9  PHOS  --  3.5 4.2   GFR: Estimated Creatinine Clearance: 87.2 mL/min (A) (by C-G formula based on SCr of 0.4 mg/dL (L)). Liver Function Tests: Recent Labs  Lab 04/05/19 0636  04/07/19 0410 04/08/19 0653  AST 25 20 19   ALT 14 12 14   ALKPHOS 121 88 90  BILITOT 0.5 0.1* <0.1*  PROT 6.4* 5.0* 5.2*  ALBUMIN 2.4* 1.9* 2.0*   No results for input(s): LIPASE, AMYLASE in the last 168 hours. No results for input(s): AMMONIA in the last 168 hours. Coagulation Profile: No results for input(s): INR, PROTIME in the last 168 hours. Cardiac Enzymes: No results for input(s): CKTOTAL, CKMB, CKMBINDEX, TROPONINI in the last 168 hours. BNP (last 3 results) No results for input(s): PROBNP in the last 8760 hours. HbA1C: No results for input(s): HGBA1C in the last 72 hours. CBG: Recent Labs  Lab 04/06/19 2116 04/07/19 0011 04/07/19 0446 04/07/19 0528 04/07/19 1149  GLUCAP 142* 104* 75 98 91   Lipid Profile: No results for input(s): CHOL, HDL, LDLCALC, TRIG, CHOLHDL, LDLDIRECT in the last 72 hours. Thyroid Function Tests: No results for input(s): TSH, T4TOTAL, FREET4, T3FREE, THYROIDAB in the last 72 hours. Anemia Panel: Recent Labs    04/07/19 0410 04/08/19 0653  FERRITIN 83 90   Sepsis Labs: Recent Labs  Lab 04/05/19 0822  PROCALCITON <0.10    Recent Results (from the past 240 hour(s))  OB Urine Culture     Status: Abnormal   Collection Time: 03/31/19  6:20 AM   Specimen: Urine, Random  Result Value Ref Range Status   Specimen Description URINE, RANDOM  Final   Special Requests NONE  Final   Culture (A)  Final    MULTIPLE SPECIES PRESENT, SUGGEST RECOLLECTION NO GROUP B STREP (S.AGALACTIAE) ISOLATED Performed at [facility], [address] [place], [facility] [address]    Report Status 04/01/2019 FINAL  Final  MRSA PCR Screening     Status: None   Collection Time: 04/06/19  6:05 PM   Specimen: Nasopharyngeal  Result Value Ref Range Status   MRSA by PCR NEGATIVE NEGATIVE Final    Comment:        The GeneXpert MRSA Assay (FDA approved for NASAL specimens only), is one component of a comprehensive MRSA colonization surveillance program.  It is not intended to diagnose MRSA infection nor to guide or monitor treatment for MRSA infections. Performed at [facility], [address] [place], [facility] [address]          Radiology Studies: DG Chest Port 1 View  Result Date: 04/07/2019 CLINICAL DATA:  Hypoxia fever history of COVID positive. EXAM: PORTABLE CHEST 1 VIEW COMPARISON:  04/06/2019 FINDINGS: Low volume chest. Accentuation of cardiomediastinal contours similar to prior study. No signs of dense consolidation or evidence of pleural effusion. Patchy peripheral opacities noted particularly at the right lung base but also projecting over right anterior second rib. No acute bone process. IMPRESSION: Subtle peripheral opacities noted particularly at the right lung base but also projecting over the right anterior second rib. Findings may be related to reported viral infection or atelectasis. No dense consolidation. Electronically Signed  By: Donzetta Kohut M.D.   On: 04/07/2019 08:13   DG CHEST PORT 1 VIEW  Result Date: 04/06/2019 CLINICAL DATA:  Hypoxia and low O2 saturations. EXAM: PORTABLE CHEST 1 VIEW COMPARISON:  04/05/2019 FINDINGS: 1224 hours. Low volume film. Cardiopericardial silhouette is at upper limits of normal for size. Increased hazy interstitial opacity lung with vascular crowding noted and interstitial pulmonary edema not excluded. There is no overt airspace pulmonary edema, pneumothorax, focal airspace consolidation, or pleural effusion. Catheter tubing overlying the left chest likely from epidural. The visualized bony structures of the thorax are intact. IMPRESSION: Low volume film with upper normal heart size and possible interstitial pulmonary edema. Electronically Signed   By: Kennith Center M.D.   On: 04/06/2019 13:08        Scheduled Meds: . vitamin C  500 mg Oral Daily  . dexamethasone  6 mg Oral Daily  . enoxaparin (LOVENOX) injection  40 mg Subcutaneous Q24H  . ipratropium  2 puff Inhalation  Q6H  . prenatal multivitamin  1 tablet Oral Q1200  . zinc sulfate  220 mg Oral Daily   Continuous Infusions: . remdesivir 100 mg in NS 100 mL 100 mg (04/08/19 1026)     LOS: 3 days     Alwyn Ren, MD [facility]  If 7PM-7AM, please contact night-coverage [email or web address] Password [email or web address] 04/08/2019, 12:00 PM

## 2019-04-08 NOTE — Discharge Instructions (Signed)
Tiene programada una infusin de Remdesivir para pacientes ambulatorios a las 1130 AM el sbado 1/30 y el domingo 1/31. Presntese en [employer] en [address] Conduce hasta el guardia de seguridad y diles que ests aqu para una infusin. Lo dirigirn a la entrada principal donde iremos a buscarlo. Si tiene preguntas, llame al [phone]. Gracias   Preguntas frecuentes sobre el COVID-19 COVID-19 Frequently Asked Questions El COVID-19 (enfermedad por coronavirus) es una infeccin causada por una gran familia de virus. Algunos virus causan [place] y otros causan enfermedades en animales tales como los camellos, los gatos y los murcilagos. En algunos casos, los virus que causan [employer] pueden transmitirse a los seres humanos. De dnde provino el coronavirus? En diciembre de 2019, [place] le inform a Chief Technology Officer (Organizacin Mundial de la [place], Best boy) acerca de varios casos de enfermedad pulmonar (enfermedad respiratoria humana). Estos casos estaban vinculados con un mercado abierto de frutos de mar y [place] en la ciudad de [place]. El vnculo con el mercado de ganado y Liberty Global sugiere que el virus puede haberse propagado de los animales a los [place]. Sin embargo, desde Chiropodist brote en diciembre, tambin se ha demostrado que el virus se contagia de [place] persona a Educational psychologist. Cul es el nombre de la enfermedad y del virus? Nombre de la enfermedad Al principio, esta enfermedad se llam nuevo coronavirus. Esto se debe a que los cientficos determinaron que la enfermedad era causada por un nuevo virus respiratorio. [employer] Corporation (Organizacin Mundial de la [place], [place]) ahora ha dado a la enfermedad el nombre de COVID-19, o enfermedad por coronavirus. Nombre del virus El virus causante de la enfermedad se conoce como coronavirus de tipo 2 causante del sndrome respiratorio agudo grave (SARS-CoV-2). Ms informacin sobre  el nombre de la enfermedad y el virus Workd Health Organization (Organizacin Mundial de la [place]) (OMS): www.who.int/emergencies/diseases/novel-coronavirus-2019/technical-guidance/naming-the-coronavirus-disease-(covid-2019)-and-the-virus-that-causes-it Quines estn en riesgo de sufrir complicaciones debido a la enfermedad por coronavirus? Algunas personas pueden tener un riesgo ms alto de tener complicaciones debido a la enfermedad por coronavirus. Entre ellas se encuentran los [employer]OK y las personas que tienen enfermedades crnicas, como enfermedad cardaca, diabetes y enfermedad pulmonar. Si tiene un riesgo ms alto de Sales executive, tome estas precauciones adicionales:  Personal assistant en su casa todo lo que sea posible.  Evitar las reuniones sociales y los viajes.  Evitar el contacto cercano con Economist. Permanecer a una distancia de al menos 6 pies (2 m) de las Nucor Corporation, si es posible.  Lavarse las manos frecuentemente con agua y [place] durante al menos 20segundos.  Evitar tocarse la cara, la boca, la nariz y los ojos.  Tener a H. J. Heinz su casa, como alimentos, medicamentos y productos de limpieza.  Si debe salir de su casa, use un barbijo de tela o una mascarilla facial. Asegrese de que le cubra la nariz y la boca. Cmo se transmite la enfermedad causada por el coronavirus? El virus que causa la enfermedad por coronavirus se transmite fcilmente de Neomia Dear persona a otra (es contagioso). Usted puede contagiarse con este virus:  Al inspirar las gotitas de una persona infectada. Las BJ's pueden diseminarse cuando una persona respira, habla, canta, tose o estornuda.  Al tocar algo, como una mesa o el picaportes de [place], que estuvo expuesto al virus (contaminado) y luego tocarse la boca, nariz o los ojos. Puedo contraer al virus al tocar superficies u objetos? Todava hay  mucho que no se conoce acerca del virus que causa la enfermedad por  coronavirus. Los cientficos basan gran parte de la informacin en lo que saben sobre virus similares, por ejemplo:  En general, los virus no sobreviven en superficies durante mucho tiempo. Necesitan un cuerpo humano (husped) para sobrevivir.  Es ms probable que el virus se contagie por contacto cercano con personas que estn enfermas (contacto directo), por ejemplo: ? Al estrechar las manos o abrazarse. ? Al inhalar las gotitas respiratorias que se desplazan por el aire. Las BJ'sgotitas pueden diseminarse cuando una persona respira, habla, canta, tose o estornuda.  Es menos probable que el virus se propague cuando una persona toca una superficie o un objeto sobre el que est el virus (contacto indirecto). El virus puede ingresar al cuerpo si la persona toca una superficie o un objeto y Express Scriptsluego se toca la cara, los ojos, la nariz o la boca. Una persona puede contagiar el virus sin tener sntomas de la enfermedad? Puede ser posible que el virus se contagie antes de que la persona tenga sntomas de la enfermedad, pero muy probablemente esta no sea la principal forma en que el virus se est propagando. Es ms probable que el virus se propague al estar en contacto estrecho con personas que estn enfermas e inhalar las gotas respiratorias que una persona disemina al respirar, Heritage managerhablar, cantar, toser o estornudar. Cules son los sntomas de la enfermedad causada por el coronavirus? Los sntomas varan de Neomia Dearuna persona a otra y pueden variar de leves a graves. Hershey CompanyEntre los sntomas, se pueden incluir los siguientes:  Teacher, English as a foreign languageiebre o escalofros.  Tos.  Dificultad para respirar o falta de aire.  Dolores de Belleair Beachcabeza, dolores en el cuerpo o dolores musculares.  Secrecin o congestin nasal.  El dolor de garganta.  Nueva prdida del sentido del gusto o del olfato.  Nuseas, vmitos o diarrea. Estos sntomas pueden aparecer en el trmino de 2 a [address] das despus de haber estado expuesto al virus. Algunas personas  quizs no tengan sntomas. Si presenta sntomas, llame al mdico. Las personas con sntomas graves pueden necesitar atencin hospitalaria. Debo hacerme un anlisis de deteccin del virus? El mdico decidir si debe realizarse un anlisis en funcin de sus sntomas, antecedentes de exposicin y factores de [place] Northriesgo. Cmo realiza el mdico el anlisis para detectar este virus? Los mdicos obtienen muestras para enviar a Chiropractoranalizar. Estas muestras pueden incluir lo siguiente:  Tomar con un hisopo Lauris Poaguna muestra de lquido de la parte posterior de la nariz y la garganta, la nariz o la garganta.  Pedirle que tosa mucosidad (esputo) para extraer lquido de los pulmones en un recipiente estril.  Tomar una muestra de [place] Beachsangre. Hay algn tratamiento o vacuna para este virus? Actualmente, no existe ninguna vacuna para prevenir la enfermedad por coronavirus. Adems, no existen Colgate Palmolivemedicamentos como los antibiticos o los antivirales para tratar el virus. Una persona que se enferma recibe tratamiento de apoyo, lo que significa reposo y lquidos. Una persona tambin puede aliviar sus sntomas con medicamentos de venta libre para tratar los estornudos, la tos y el goteo nasal. Son los mismos medicamentos que se toman para el resfro comn. Si presenta sntomas, llame al mdico. Las personas con sntomas graves pueden necesitar atencin hospitalaria. Qu puedo hacer para protegerme y proteger a mi familia de este virus?     Puede protegerse y proteger a su familia tomando las mismas medidas que tomara para prevenir el contagio de otros virus. Tome las siguientes medidas:  Jamey RipaLavarse  las manos frecuentemente con agua y jabn durante al menos 20segundos. Usar desinfectante para manos con alcohol si no dispone de [place] y Belarusjabn.  Evitar tocarse la cara, la boca, la nariz y los ojos.  Toser o estornudar en un pauelo descartable, sobre su manga o codo. No toser o estornudar al aire ni cubrirse con la Tomalesmano. ? Si tose o  estornuda en un pauelo de papel, deschelo inmediatamente y Verizonlvese las manos.  Desinfectar los TEPPCO Partnersobjetos y las superficies que se tocan con frecuencia todos North Judsonlos das.  Aljese de Engelhard Corporationlas personas enfermas.  Evite salir de su casa, siga las indicaciones de su estado y de las autoridades sanitarias locales.  Evite los espacios interiores repletos de gente. Permanezca a una distancia de al menos 6 pies (2 m) de las Nucor Corporationotras personas.  Si debe salir de su casa, use un barbijo de tela o una mascarilla facial. Asegrese de que le cubra la nariz y la boca.  Lennie HummerQuedarse en su casa si est enfermo, excepto para obtener atencin mdica. Llame al mdico antes de buscar atencin mdica. El mdico le indicar cunto tiempo debe quedarse en casa.  Asegrese de EchoStartener las vacunas al da. Pregntele al mdico qu vacunas necesita. Qu debo hacer si tengo que viajar? Siga las recomendaciones relacionadas con los viajes de la autoridad de Psychiatristsalud local, los CDC y Engineer, civil (consulting)la OMS. Informacin y consejos para Nurse, adultviajeros  Centers for Disease Control and Prevention Insurance claims handler(CDC) (Centros para el Control y la Prevencin de Event organisernfermedades): GeminiCard.glwww.cdc.gov/coronavirus/2019-ncov/travelers/index.html  Organizacin Mundial de Radiographer, therapeuticla Salud (OMS): PreviewDomains.sewww.who.int/emergencies/diseases/novel-coronavirus-2019/travel-advice Fisher ScientificConozca los riesgos y tome medidas para proteger su salud  El riesgo de Primary school teachercontraer la enfermedad por coronavirus es ms alto si viaja a zonas con un brote o si est en contacto con viajeros que provienen de zonas donde hay un brote.  Lvese las manos con frecuencia y Spainmantenga una higiene Svalbard & Jan Mayen Islandsadecuada para reducir el riesgo de contagiarse o transmitir el virus. Qu debo hacer si estoy enfermo? Instrucciones generales para detener la propagacin de la infeccin  Lavarse las manos frecuentemente con agua y jabn durante al menos 20segundos. Usar desinfectante para manos con alcohol si no dispone de [place] y Belarusjabn.  Toser o estornudar en un pauelo  descartable, sobre su manga o codo. No toser o estornudar al aire ni cubrirse con la [place].  Si tose o estornuda en un pauelo de papel, deschelo inmediatamente y Verizonlvese las manos.  Lanny HurstQudese en su casa a menos que deba recibir Computer Sciences Corporationatencin mdica. Llame al mdico o a la autoridad de salud local antes de buscar atencin mdica.  Evite las zonas pblicas. No viaje en transporte pblico, de ser posible.  Si puede, use un barbijo si debe salir de la casa o si est en contacto cercano con alguien que no est enfermo. Asegrese de que le cubra la nariz y la boca. Mantenga su casa limpia  Desinfecte los objetos y las superficies que se tocan con frecuencia todos [place] Pointlos das. Pueden incluir: ? Encimeras y Smithfieldmesas. ? Picaportes e interruptores de luz. ? Lavabos, fregaderos y grifos. ? Aparatos electrnicos tales como telfonos, controles remotos, teclados, computadoras y tabletas.  Lave los platos con agua jabonosa caliente o en el lavavajillas. Deje los platos para que se sequen al aire.  Lave la ropa con agua caliente. Evite infectar a otros miembros de la familia  Permita que los miembros de la familia sanos cuiden a los nios y las Myrtlewoodmascotas, si es posible. Si tiene que cuidar a los nios o las Eagles Meremascotas,  lvese las manos con frecuencia y use un barbijo.  Duerma en una habitacin o cama diferentes, si es posible.  No comparta elementos personales, como afeitadoras, cepillos de dientes, desodorantes, peines, cepillos, toallas y toallitas de mano. Dnde buscar ms informacin Centers for Disease Control and Prevention (CDC)  Actualizaciones de informacin y novedades: https://www.butler-gonzalez.com/ Organizacin Mundial de la Salud (OMS)  Actualizaciones de informacin y novedades: MissExecutive.com.ee  Tema de salud relacionado con el coronavirus: https://www.castaneda.info/  Preguntas y Chartered certified accountant sobre COVID-19:  OpportunityDebt.at  Registro mundial: who.sprinklr.com American Academy of Pediatrics (AAP) ([place])  Informacin para familias: www.healthychildren.org/English/health-issues/conditions/chest-lungs/Pages/2019-Novel-Coronavirus.aspx La situacin del coronavirus cambia rpidamente. Consulte el sitio web de su autoridad de Arboriculturist o los sitios web de los CDC y la OMS para enterarse [place] novedades y noticias. Cundo debo comunicarme con un mdico?  Comunquese con su mdico si tiene sntomas de infeccin, como fiebre o tos, y: ? Walker de alguien que sabe que tiene la enfermedad por coronavirus. ? Devota Pace en contacto con una persona que presuntamente sufra de la enfermedad por coronavirus. ? Ha viajado a una zona donde hay un brote de COVID-19. Cundo debo buscar asistencia mdica inmediata?  Busque ayuda de inmediato llamando al servicio de emergencias de su localidad (911 en los [place]) si tiene lo siguiente: ? Dificultad para respirar. ? Dolor u opresin en el pecho. ? Confusin. ? Labios y uas de BJ's Wholesale. ? Dificultad para despertarse. ? Sntomas que empeoran. Informe al personal mdico de emergencias si cree que tiene la enfermedad por coronavirus. Resumen  Un nuevo virus respiratorio se propaga de Ardelia Mems persona a otra y causa COVID-19 (enfermedad por coronavirus).  El virus que causa el COVID-19 parece diseminarse fcilmente. Se transmite de [place] persona a otra a travs de las SUPER[employer] INC se despiden al respirar, Electrical engineer, cantar, toser o estornudar.  Los Anadarko Petroleum Corporation y las personas que tienen enfermedades crnicas tienen mayor riesgo de Emergency planning/management officer enfermedad. Si tiene un riesgo ms alto de tener complicaciones, tome Geophysical data processor.  Actualmente, no existe ninguna vacuna para prevenir la enfermedad por coronavirus. No existen medicamentos, como los antibiticos o los antivirales,  para tratar el virus.  Puede protegerse y proteger a su familia al lavarse las manos con frecuencia, evitar tocarse la cara y cubrirse al toser y Brewing technologist. Esta informacin no tiene Marine scientist el consejo del mdico. Asegrese de hacerle al mdico cualquier pregunta que tenga. Document Revised: 12/30/2018 Document Reviewed: 07/05/2018 Elsevier Patient Education  [place] vaginal, cuidados de puerperio Postpartum Care After Vaginal Delivery Lea esta informacin sobre cmo cuidarse desde el momento en que nazca su beb y Waylan Boga 6 a 12 semanas despus del parto (perodo del posparto). El mdico tambin podr darle instrucciones ms especficas. Comunquese con su mdico si tiene problemas o preguntas. Siga estas indicaciones en su casa: Hemorragia vaginal  Es normal tener un poco de hemorragia vaginal (loquios) despus del parto. Use un apsito sanitario para el sangrado vaginal y secrecin. ? Durante la primera semana despus del parto, la cantidad y el aspecto de los loquios a menudo es similar a las del perodo menstrual. ? Durante las siguientes semanas disminuir gradualmente hasta convertirse en una secrecin seca amarronada o [place]. ? En la AGCO Corporation, los loquios se detienen Franklin Resources 4 a 6semanas despus del [employer] [facility]. Los sangrados vaginales pueden variar de mujer a Art therapist.  Cambie los apsitos sanitarios con frecuencia. Observe si hay cambios en  el flujo, como: ? Un aumento repentino en el volumen. ? Cambio en el color. ? Cogulos sanguneos grandes.  Si expulsa un cogulo de sangre por la vagina, gurdelo y llame al mdico para informrselo. No deseche los cogulos de sangre por el inodoro antes de hablar con su mdico.  No use tampones ni se haga duchas vaginales hasta que el mdico la autorice.  Si no est amamantando, volver a tener su perodo entre 6 y 8 semanas despus del parto. Si solamente alimenta al beb con Alexis Goodell  materna (lactancia materna exclusiva), podra no volver a tener su perodo hasta que deje de [place]. Cuidados perineales  Mantenga la zona entre la vagina y el ano (perineo) limpia y [place], [place] se lo haya indicado el mdico. Utilice apsitos o aerosoles analgsicos y Control and instrumentation engineer, como se lo hayan indicado.  Si le hicieron un corte en el perineo (episiotoma) o tuvo un desgarro en la vagina, controle la zona para detectar signos de infeccin hasta que sane. Est atenta a los siguientes signos: ? Aumento del enrojecimiento, la hinchazn o Chief Technology Officer. ? Presenta lquido o sangre que supura del corte o Insurance account manager. ? Calor. ? Pus o mal olor.  Es posible que le den una botella rociadora para que use en lugar de limpiarse el rea con papel higinico despus de usar el bao. Cuando comience a Barrister's clerk, podr usar la botella rociadora antes de secarse. Asegrese de secarse suavemente.  Para aliviar el dolor causado por una episiotoma, un desgarro en la vagina o venas hinchadas en el ano (hemorroides), trate de tomar un bao de asiento tibio 2 o 3 veces por da. Un bao de asiento es un bao de agua tibia que se toma mientras se est sentado. El agua solo debe Adult nurse las caderas y cubrir las nalgas. Cuidado de las [address]  En los [address] despus del parto, las mamas pueden sentirse pesadas, llenas e incmodas (congestin [place]). Tambin puede escaparse leche de sus senos. El mdico puede sugerirle mtodos para Emergency planning/management officer. La congestin mamaria debera desaparecer al cabo de The Mutual of Omaha.  Si est amamantando: ? Use un sostn que sujete y ajuste bien sus pechos. ? Mantenga los pezones secos y limpios. Aplquese cremas y ungentos, como se lo haya indicado el mdico. ? Es posible que deba usar discos de algodn en el sostn para Environmental health practitioner la [place] que se filtre de sus senos. ? Puede tener contracciones uterinas cada vez que amamante durante varias semanas despus del Roy. Las contracciones  uterinas ayudan al tero a Hotel manager a su tamao habitual. ? Si tiene algn problema con la lactancia materna, colabore con el mdico o un Holiday representative.  Si no est amamantando: ? Evite tocarse Xcel Energy. Al hacerlo, podran producir ms leche. ? Use un sostn que le proporcione el ajuste correcto y compresas fras para reducir la hinchazn. ? No extraiga (saque) Colgate Palmolive. Esto har que produzca ms W[employer] Resources. Intimidad y sexualidad  Pregntele al mdico cundo puede retomar la actividad sexual. Esto puede depender de lo siguiente: ? Su riesgo de sufrir infecciones. ? La rapidez con la que est sanando. ? Su comodidad y deseo de retomar la actividad sexual.  Despus del parto, puede quedar embarazada incluso si no ha tenido todava su perodo. Si lo desea, hable con el mdico acerca de los mtodos de control de la natalidad (mtodos anticonceptivos). Medicamentos  Baxter International de venta libre y los recetados solamente como se lo haya indicado el mdico.  Si le recetaron un antibitico, tmelo como se lo haya indicado el mdico. No deje de tomar el antibitico aunque comience a sentirse mejor. Actividad  Retome sus actividades normales de a poco como se lo haya indicado el mdico. Pregntele al mdico qu actividades son seguras para usted.  Descanse todo lo que pueda. Trate de descansar o tomar una siesta mientras el beb duerme. Comida y bebida   Beba suficiente lquido como para Pharmacologist la orina de color amarillo plido.  Coma alimentos ricos en Enbridge Energy. Estos pueden ayudarla a prevenir o Educational psychologist. Los alimentos ricos en fibras incluyen, entre otros: ? Panes y cereales integrales. ? Arroz integral. ? Armed forces operational officer. ? Nils Pyle y verduras frescas.  No intente perder de peso rpidamente reduciendo el consumo de caloras.  Tome sus vitaminas prenatales hasta la visita de seguimiento de posparto o hasta que su mdico le indique que puede  dejar de tomarlas. Estilo de vida  No consuma ningn producto que contenga nicotina o tabaco, como cigarrillos y Administrator, Civil Service. Si necesita ayuda para dejar de fumar, consulte al mdico.  No beba alcohol, especialmente si est amamantando. Instrucciones generales  Concurra a todas las visitas de seguimiento para usted y el beb, como se lo haya indicado el mdico. La mayora de las mujeres visita al mdico para un seguimiento de posparto dentro de las primeras 3 a 6 semanas despus del parto. Comunquese con un mdico si:  Se siente incapaz de controlar los cambios que implica tener un hijo y esos sentimientos no desaparecen.  Siente tristeza o preocupacin de forma inusual.  Las mamas se ponen rojas, le duelen o se endurecen.  Tiene fiebre.  Tiene dificultad para retener la [place] o para impedir que la orina se escape.  Tiene poco inters o falta de inters en actividades que solan gustarle.  No ha amamantado nada y no ha tenido un perodo menstrual durante 12 semanas despus del [place].  Dej de amamantar al beb y no ha tenido su perodo menstrual durante 12 semanas despus de dejar de Museum/gallery exhibitions officer.  Tiene preguntas sobre su cuidado y el del beb.  Elimina un cogulo de sangre grande por la vagina. Solicite ayuda de inmediato si:  Midwife.  Tiene dificultad para respirar.  Tiene un dolor repentino e intenso en la pierna.  Tiene dolor intenso o clicos en el la parte inferior del abdomen.  Tiene una hemorragia tan intensa de la vagina que empapa ms de un apsito en [employer]. El sangrado no debe ser ms abundante que el perodo ms intenso que haya tenido.  Dolor de cabeza intenso.  Se desmaya.  Tiene visin borrosa o Nurse, adult.  Tiene secrecin vaginal con mal olor.  Tiene pensamientos acerca de lastimarse a usted misma o a su beb. Si alguna vez siente que puede lastimarse a usted misma o a Economist, o tiene pensamientos  de poner fin a su vida, busque ayuda de inmediato. Puede dirigirse al departamento de emergencias ms cercano o llamar a:  El servicio de emergencias de su localidad (911 en [place].).  Una lnea de asistencia al suicida y Visual merchandiser en crisis, como la [employer] Oil de Prevencin del Suicidio (National Suicide Prevention Lifeline), al [phone]. Est disponible las 24 horas del da. Resumen  El perodo de tiempo justo despus el parto y Elaina Hoops 6 a 12 semanas despus del parto se denomina perodo posparto.  Retome sus actividades normales de a MeadWestvaco se lo  haya indicado el mdico.  Concurra a todas las visitas de seguimiento para usted y Dance movement psychotherapist beb, como se lo haya indicado el mdico. Esta informacin no tiene Theme park manager el consejo del mdico. Asegrese de hacerle al mdico cualquier pregunta que tenga. Document Revised: 06/06/2017 Document Reviewed: 02/15/2017 Elsevier Patient Education  2020 Arvin[employer]itor.

## 2019-04-08 NOTE — Progress Notes (Signed)
Pt was to be given ATROVENT at 0200. Inhaler is left in room. RN entered room to scan and administer ATROVENT, but pt states she does not know where she placed it. RN contacted pharm,acy for replacement. Pharmacy informed RN that replacement will be expensive, and to see if pt can find inhaler. RN informed pt that it will be costly to replace inhaler, and to see if she can locate current inhaler. Pt stated she will look for it. Elam Dutch

## 2019-04-08 NOTE — Progress Notes (Signed)
Patient scheduled for outpatient Remdesivir infusion at 1130AM on Saturday 1/30 and Sunday 1/31.  Please advise them to report to [facility] at [address]  Drive to the security guard and tell them you are here for an infusion. They will direct you to the front entrance where we will come and get you.  For questions call [phone].  Thanks

## 2019-04-08 NOTE — Lactation Note (Signed)
This note was copied from a baby's chart. Lactation Consultation Note LC asked mom if this was a good time to visit for Lactation. Room dark. Mom asked to come back later.   Patient Name: Leah Fernandez Date: 04/08/2019     Maternal Data    Feeding Feeding Type: Breast Fed  LATCH Score Latch: Grasps breast easily, tongue down, lips flanged, rhythmical sucking.  Audible Swallowing: Spontaneous and intermittent  Type of Nipple: Everted at rest and after stimulation  Comfort (Breast/Nipple): Soft / non-tender  Hold (Positioning): No assistance needed to correctly position infant at breast.  LATCH Score: 10  Interventions    Lactation Tools Discussed/Used     Consult Status      Charyl Dancer 04/08/2019, 12:32 AM

## 2019-04-09 ENCOUNTER — Ambulatory Visit (HOSPITAL_COMMUNITY)
Admission: RE | Admit: 2019-04-09 | Discharge: 2019-04-09 | Disposition: A | Discharge status: Home / Self Care | Payer: Medicaid Other | Source: Ambulatory Visit | Attending: Pulmonary Disease | Admitting: Pulmonary Disease

## 2019-04-09 VITALS — BP 105/71 | HR 74 | Temp 97.9°F | Resp 18

## 2019-04-09 DIAGNOSIS — O98513 Other viral diseases complicating pregnancy, third trimester: Secondary | ICD-10-CM | POA: Diagnosis present

## 2019-04-09 DIAGNOSIS — U071 COVID-19: Secondary | ICD-10-CM | POA: Diagnosis present

## 2019-04-09 MED ORDER — SODIUM CHLORIDE 0.9 % IV SOLN
INTRAVENOUS | Status: AC
  Administered 2019-04-09: 100 mg via INTRAVENOUS
  Filled 2019-04-09: qty 20

## 2019-04-09 MED ORDER — ALBUTEROL SULFATE HFA 108 (90 BASE) MCG/ACT IN AERS: 2.0000 | INHALATION_SPRAY | Freq: Once | RESPIRATORY_TRACT | Status: DC | PRN

## 2019-04-09 MED ORDER — EPINEPHRINE 0.3 MG/0.3ML IJ SOAJ: 0.3000 mg | Freq: Once | INTRAMUSCULAR | Status: DC | PRN

## 2019-04-09 MED ORDER — DIPHENHYDRAMINE HCL 50 MG/ML IJ SOLN: 50.0000 mg | Freq: Once | INTRAMUSCULAR | Status: DC | PRN

## 2019-04-09 MED ORDER — SODIUM CHLORIDE 0.9 % IV SOLN: INTRAVENOUS | Status: DC | PRN

## 2019-04-09 MED ORDER — SODIUM CHLORIDE 0.9 % IV SOLN: 100.0000 mg | Freq: Once | INTRAVENOUS | Status: AC

## 2019-04-09 MED ORDER — METHYLPREDNISOLONE SODIUM SUCC 125 MG IJ SOLR: 125.0000 mg | Freq: Once | INTRAMUSCULAR | Status: DC | PRN

## 2019-04-09 MED ORDER — FAMOTIDINE IN NACL 20-0.9 MG/50ML-% IV SOLN: 20.0000 mg | Freq: Once | INTRAVENOUS | Status: DC | PRN

## 2019-04-09 NOTE — Progress Notes (Signed)
  Diagnosis: COVID-19  Physician:Dr Wright  Procedure: [facility] Med: remdesivir infusion.  Complications: No immediate complications noted.  Discharge: Discharged home   Leah Fernandez W 04/09/2019  

## 2019-04-09 NOTE — Discharge Instructions (Signed)
COVID-19 COVID-19 El COVID-19 es una infeccin respiratoria causada por un virus llamado coronavirus tipo 2 causante del sndrome respiratorio agudo grave (SARS-CoV-2). La enfermedad tambin se conoce como enfermedad por coronavirus o nuevo coronavirus. En algunas personas, el virus puede no ocasionar sntomas. En otras, puede producir una infeccin grave. La infeccin puede empeorar rpidamente y causar complicaciones, como:  Neumona o infeccin en los pulmones.  Sndrome de dificultad respiratoria aguda o SDRA. Es una afeccin que se caracteriza por la acumulacin de lquido en los pulmones, que impide que los pulmones se llenen de aire y pasen oxgeno a la sangre.  Insuficiencia respiratoria aguda. Es una afeccin que se caracteriza porque no pasa suficiente oxgeno de los pulmones al cuerpo o porque el dixido de carbono no pasa de los pulmones hacia afuera del cuerpo.  Sepsis o choque sptico. Se trata de una reaccin grave del cuerpo ante una infeccin.  Problemas de coagulacin.  Infecciones secundarias debido a bacterias u hongos.  Falla de rganos. Ocurre cuando los rganos del cuerpo dejan de funcionar. El virus que causa el COVID-19 es contagioso. Esto significa que puede transmitirse de una persona a otra a travs de las gotitas de saliva de la tos y de los estornudos (secreciones respiratorias). Cules son las causas? Esta enfermedad es causada por un virus. Usted puede contagiarse con este virus:  Al inspirar las gotitas de una persona infectada. Las gotitas pueden diseminarse cuando una persona respira, habla, canta, tose o estornuda.  Al tocar algo, como una mesa o el picaportes de una puerta, que estuvo expuesto al virus (contaminado) y luego tocarse la boca, nariz o los ojos. Qu incrementa el riesgo? Riesgo de infeccin Es ms probable que se infecte con este virus si:  Se encuentra a una distancia menor a 6 pies (2 metros) de una persona con COVID-19.  Cuida o  vive con una persona infectada con COVID-19.  Pasa tiempo en espacios interiores repletos de gente o vive en viviendas compartidas. Riesgo de enfermedad grave Es ms probable que se enferme gravemente por el virus si:  Tiene 50aos o ms. Cuanto mayor sea su edad, mayor ser el riesgo de tener una enfermedad grave.  Vive en un hogar de ancianos o centro de atencin a largo plazo.  Tiene cncer.  Tiene una enfermedad prolongada (crnica), como las siguientes: ? Enfermedad pulmonar crnica, que incluye la enfermedad pulmonar obstructiva crnica o asma. ? Una enfermedad crnica que disminuye la capacidad del cuerpo para combatir las infecciones (immunocomprometido). ? Enfermedad cardaca, que incluye insuficiencia cardaca, una afeccin que se caracteriza porque las arterias que llegan al corazn se estrechan u obstruyen (arteriopata coronaria) o una enfermedad que provoca que el msculo cardaco se engrose, se debilite o endurezca (miocardiopata). ? Diabetes. ? Enfermedad renal crnica. ? Anemia drepanoctica, una enfermedad que se caracteriza porque los glbulos rojos tienen una forma anormal de "hoz". ? Enfermedad heptica.  Es obeso. Cules son los signos o sntomas? Los sntomas de esta afeccin pueden ser de leves a graves. Los sntomas pueden aparecer en el trmino de 2 a 14 das despus de haber estado expuesto al virus. Incluyen los siguientes:  Fiebre o escalofros.  Tos.  Dificultad para respirar.  Dolores de cabeza, dolores en el cuerpo o dolores musculares.  Secrecin o congestin nasal.  Dolor de garganta.  Nueva prdida del sentido del gusto o del olfato. Algunas personas tambin pueden tener problemas estomacales, como nuseas, vmitos o diarrea. Es posible que otras personas no tengan sntomas de COVID-19.   Cmo se diagnostica? Esta afeccin se puede diagnosticar en funcin de lo siguiente:  Sus signos y sntomas, especialmente si: ? Vive en una zona  donde hay un brote de COVID-19. ? Viaj recientemente a una zona donde el virus es frecuente. ? Cuida o vive con una persona a quien se le diagnostic COVID-19. ? Estuvo expuesto a una persona a la que se le diagnostic COVID-19.  Un examen fsico.  Anlisis de laboratorio que pueden incluir: ? Tomar una muestra de lquido de la parte posterior de la nariz y la garganta (lquido nasofarngeo), la nariz o la garganta, con un hisopo. ? Una muestra de mucosidad de los pulmones (esputo). ? Anlisis de sangre.  Los estudios de diagnstico por imgenes pueden incluir radiografas, exploracin por tomografa computarizada (TC) o ecografa. Cmo se trata? En este momento, no hay ningn medicamento para tratar el COVID-19. Los medicamentos para tratar otras enfermedades se usan a modo de ensayo para comprobar si son eficaces contra el COVID-19. El mdico le informar sobre las maneras de tratar los sntomas. En la mayora de las personas, la infeccin es leve y puede controlarse en el hogar con reposo, lquidos y medicamentos de venta libre. El tratamiento para una infeccin grave suele realizarse en la unidad de cuidados intensivos (UCI) de un hospital. Puede incluir uno o ms de los siguientes. Estos tratamientos se administran hasta que los sntomas mejoran.  Recibir lquidos y medicamentos a travs de una va intravenosa.  Oxgeno complementario. Para administrar oxgeno extra, se utiliza un tubo en la nariz, una mascarilla o una campana de oxgeno.  Colocarlo para que se recueste boca abajo (decbito prono). Esto facilita el ingreso de oxgeno a los pulmones.  Uso continuo de una mquina de presin positiva de las vas areas (CPAP) o de presin positiva de las vas areas de dos niveles (BPAP). Este tratamiento utiliza una presin de aire leve para mantener las vas respiratorias abiertas. Un tubo conectado a un motor administra oxgeno al cuerpo.  Respirador. Este tratamiento mueve el aire  dentro y fuera de los pulmones mediante el uso de un tubo que se coloca en la trquea.  Traqueostoma. En este procedimiento se hace un orificio en el cuello para insertar un tubo de respiracin.  Oxigenacin por membrana extracorprea (OMEC). En este procedimiento, los pulmones tienen la posibilidad de recuperarse al asumir las funciones del corazn y los pulmones. Suministra oxgeno al cuerpo y elimina el dixido de carbono. Siga estas instrucciones en su casa: Estilo de vida  Si est enfermo, qudese en su casa, excepto para obtener atencin mdica. El mdico le indicar cunto tiempo debe quedarse en casa. Llame al mdico antes de buscar atencin mdica.  Haga reposo en su casa como se lo haya indicado el mdico.  No consuma ningn producto que contenga nicotina o tabaco, como cigarrillos, cigarrillos electrnicos y tabaco de mascar. Si necesita ayuda para dejar de fumar, consulte al mdico.  Retome sus actividades normales segn lo indicado por el mdico. Pregntele al mdico qu actividades son seguras para usted. Instrucciones generales  Use los medicamentos de venta libre y los recetados solamente como se lo haya indicado el mdico.  Beba suficiente lquido como para mantener la orina de color amarillo plido.  Concurra a todas las visitas de seguimiento como se lo haya indicado el mdico. Esto es importante. Cmo se evita?  No hay ninguna vacuna que ayude a prevenir la infeccin por COVID-19. Sin embargo, hay medidas que puede tomar para protegerse y proteger a   otras personas de este virus. Para protegerse:   No viaje a zonas donde el COVID-19 sea un riesgo. Las zonas donde se informa la presencia del COVID-19 cambian con frecuencia. Para identificar las zonas de alto riesgo y las restricciones de viaje, consulte el sitio web de viajes de los Centers for Disease Control and Prevention (CDC) (Centros para el Control y la Prevencin de Enfermedades):  wwwnc.cdc.gov/travel/notices  Si vive o debe viajar a una zona donde el COVID-19 es un riesgo, tome precauciones para evitar infecciones. ? Aljese de las personas enfermas. ? Lvese las manos frecuentemente con agua y jabn durante 20segundos. Use desinfectante para manos con alcohol si no dispone de agua y jabn. ? Evite tocarse la boca, la cara, los ojos o la nariz. ? Evite salir de su casa, siga las indicaciones de su estado y de las autoridades sanitarias locales. ? Si debe salir de su casa, use un barbijo de tela o una mascarilla facial. Asegrese de que le cubra la nariz y la boca. ? Evite los espacios interiores repletos de gente. Mantenga una distancia de al menos 6 pies (2 metros) de otras personas. ? Desinfecte los objetos y las superficies que se tocan con frecuencia todos los das. Pueden incluir:  Encimeras y mesas.  Picaportes e interruptores de luz.  Lavabos, fregaderos y grifos.  Aparatos electrnicos tales como telfonos, controles remotos, teclados, computadoras y tabletas. Cmo proteger a los dems: Si tiene sntomas de COVID-19, tome medidas para evitar que el virus se propague a otras personas.  Si cree que tiene una infeccin por COVID-19, comunquese de inmediato con su mdico. Informe al equipo de atencin mdica que cree que puede tener una infeccin por el COVID-19.  Qudese en su casa. Salga de su casa solo para buscar atencin mdica. No utilice el transporte pblico.  No viaje mientras est enfermo.  Lvese las manos frecuentemente con agua y jabn durante 20segundos. Usar desinfectante para manos con alcohol si no dispone de agua y jabn.  Mantngase alejado de quienes vivan con usted. Permita que los miembros de la familia sanos cuiden a los nios y las mascotas, si es posible. Si tiene que cuidar a los nios o las mascotas, lvese las manos con frecuencia y use un barbijo. Si es posible, permanezca en su habitacin, separado de los dems. Utilice un  bao diferente.  Asegrese de que todas las personas que viven en su casa se laven bien las manos y con frecuencia.  Tosa o estornude en un pauelo de papel o sobre su manga o codo. No tosa o estornude al aire ni se cubra la boca o la nariz con la mano.  Use un barbijo de tela o una mascarilla facial. Asegrese de que le cubra la nariz y la boca. Dnde buscar ms informacin  Centers for Disease Control and Prevention (Centros para el Control y la Prevencin de Enfermedades): www.cdc.gov/coronavirus/2019-ncov/index.html  World Health Organization (Organizacin Mundial de la Salud): www.who.int/health-topics/coronavirus Comunquese con un mdico si:  Vive o ha viajado a una zona donde el COVID-19 es un riesgo y tiene sntomas de infeccin.  Ha tenido contacto con alguien que tiene COVID-19 y usted tiene sntomas de infeccin. Solicite ayuda inmediatamente si:  Tiene dificultad para respirar.  Siente dolor u opresin en el pecho.  Experimenta confusin.  Tiene las uas de los dedos y los labios de color azulado.  Tiene dificultad para despertarse.  Los sntomas empeoran. Estos sntomas pueden representar un problema grave que constituye una emergencia. No espere   a ver si los sntomas desaparecen. Solicite atencin mdica de inmediato. Comunquese con el servicio de emergencias de su localidad (911 en los [place]). No conduzca por sus propios medios hasta el hospital. Informe al personal mdico de emergencias si cree que tiene COVID-19. Resumen  El COVID-19 es una infeccin respiratoria causada por un virus. Tambin se conoce como enfermedad por coronavirus o nuevo coronavirus. Puede causar infecciones graves, como neumona, sndrome de dificultad respiratoria aguda, insuficiencia respiratoria aguda o sepsis.  El virus que causa el COVID-19 es contagioso. Esto significa que puede transmitirse de una persona a otra a travs de las gotitas que se despiden al respirar, hablar,  cantar, toser y estornudar.  Es ms probable que desarrolle una enfermedad grave si tiene 50 aos o ms, tiene el sistema inmunitario dbil, vive en un hogar de ancianos o tiene una enfermedad crnica.  No hay ningn medicamento para tratar el COVID-19. El mdico le informar sobre las maneras de tratar los sntomas.  Tome medidas para protegerse y proteger a los dems contra las infecciones. Lvese las manos con frecuencia y desinfecte los objetos y las superficies que se tocan con frecuencia todos los das. Mantngase alejado de las personas que estn enfermas y use un barbijo si est enfermo. Esta informacin no tiene como fin reemplazar el consejo del mdico. Asegrese de hacerle al mdico cualquier pregunta que tenga. Document Revised: 12/30/2018 Document Reviewed: 04/24/2018 Elsevier Patient Education  2020 Elsevier Inc.  

## 2019-04-10 ENCOUNTER — Ambulatory Visit (HOSPITAL_COMMUNITY): Payer: Medicaid Other

## 2019-04-11 ENCOUNTER — Encounter: Payer: Medicaid Other | Admitting: Obstetrics and Gynecology

## 2019-04-11 ENCOUNTER — Other Ambulatory Visit: Payer: Medicaid Other

## 2019-04-18 ENCOUNTER — Other Ambulatory Visit: Payer: Medicaid Other

## 2019-04-18 ENCOUNTER — Encounter: Payer: Medicaid Other | Admitting: Obstetrics & Gynecology

## 2019-05-04 ENCOUNTER — Ambulatory Visit: Payer: Medicaid Other | Admitting: Obstetrics & Gynecology

## 2019-05-04 NOTE — Progress Notes (Deleted)
   Patient did not show up today for her scheduled appointment.   Aspen Deterding, MD, FACOG Obstetrician & Gynecologist, [facility], [facility]  

## 2019-05-05 ENCOUNTER — Ambulatory Visit (INDEPENDENT_AMBULATORY_CARE_PROVIDER_SITE_OTHER): Payer: Medicaid Other | Admitting: Family Medicine

## 2019-05-05 ENCOUNTER — Other Ambulatory Visit: Payer: Self-pay

## 2019-05-05 ENCOUNTER — Encounter: Payer: Self-pay | Admitting: Family Medicine

## 2019-05-05 DIAGNOSIS — Z30431 Encounter for routine checking of intrauterine contraceptive device: Secondary | ICD-10-CM

## 2019-05-05 NOTE — Progress Notes (Signed)
Subjective:     Leah Fernandez is a 44 y.o. female who presents for a postpartum visit. She is 4 weeks 1 day postpartum following a spontaneous vaginal delivery. I have fully reviewed the prenatal and intrapartum course. The delivery was at 37 weeks 3 days. Outcome: spontaneous vaginal delivery. Anesthesia: epidural. Postpartum course has been uncomplicated- IUD placed at delivery. Baby's course has been complicated by weight loss and needs audiology follow up. Baby is feeding by breast- exclusively - pumping at least 3 ox per breast currently and breastfeeding. Breast fed other children until 1.5 yo. Bleeding no bleeding. Bowel function is normal. Bladder function is normal. Patient is not sexually active. Contraception method is IUD. Postpartum depression screening: negative.  The following portions of the patient's history were reviewed and updated as appropriate: allergies, current medications, past family history, past medical history, past social history, past surgical history and problem list.  Review of Systems Pertinent items are noted in HPI.   Objective:    BP 111/70   Pulse (!) 57   Wt 154 lb 1.6 oz (69.9 kg)   Breastfeeding Yes   BMI 29.12 kg/m   General:  alert, cooperative and appears stated age   Breasts:  no concerns- no nipple trauma  Lungs: clear to auscultation bilaterally  Heart:  RR  Abdomen: soft, non-tender; bowel sounds normal; no masses,  no organomegaly   Vulva:  normal  Vagina: normal vagina  Cervix:  multiparous appearance- IUD in place, trimmed 3 cm from strings   Corpus: not examined  Adnexa:  not evaluated  Rectal Exam: Not performed.        Assessment:   Normal  postpartum exam. Pap smear not done at today's visit.   Plan:    1. Contraception: IUD 2. Mood- WNL. Counseled about seeking care if worsens 3. Feeding- giving exclusive breast milk no issues and has great supply. No problems with latch. Counseled on 6 month exclusive BM feeding with  continuation for as long as desired. Reviewed office as resource for breastfeeding issues 3. Follow up in: 1 year or as needed.

## 2019-11-23 DIAGNOSIS — Z20822 Contact with and (suspected) exposure to covid-19: Secondary | ICD-10-CM | POA: Diagnosis not present

## 2021-03-27 IMAGING — DX DG CHEST 1V PORT
1 series · 1 of 1 positions shown · non-contrast
Comparison: None.

CLINICAL DATA: Cough. Fever and chills. COVID positive. Thirty-six
weeks pregnant.

EXAM:
PORTABLE CHEST 1 VIEW

[chest ap]
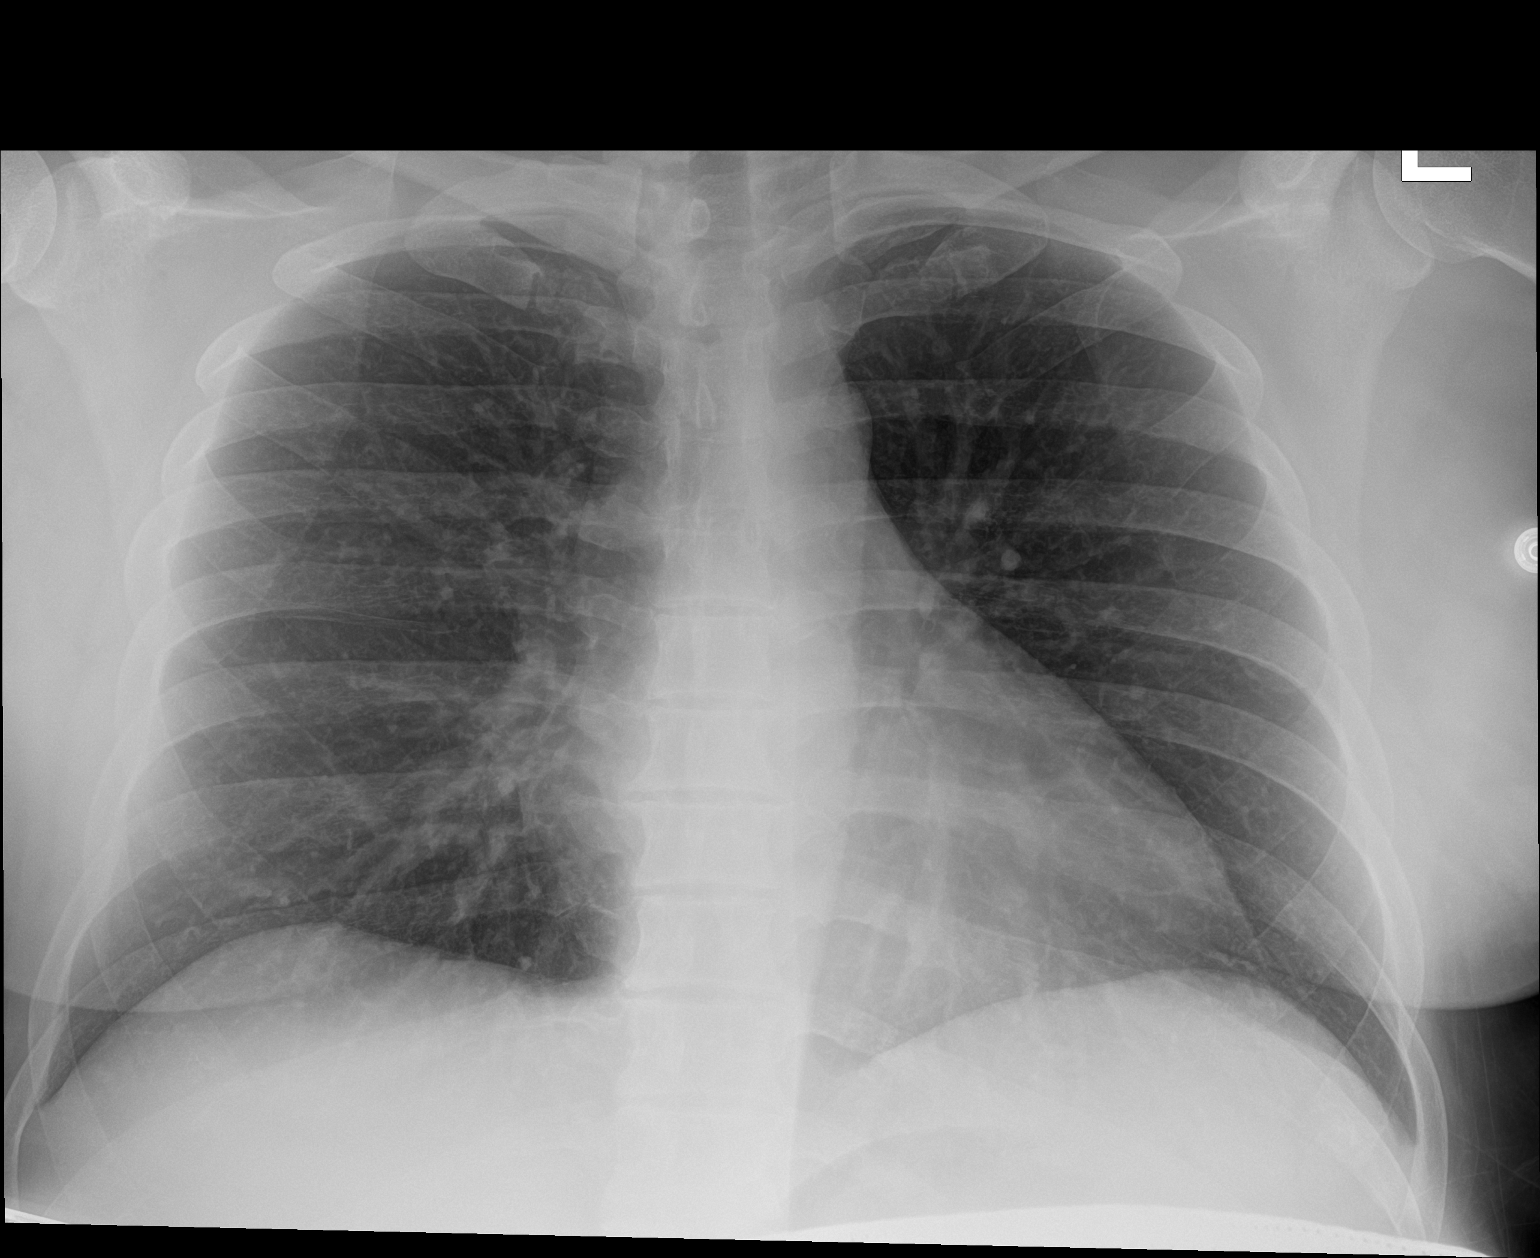

[1 of 1 positions shown; findings below may reference images not displayed]

FINDINGS: The cardiomediastinal contours are normal. Subsegmental opacity at
the right lung base. Pulmonary vasculature is normal. No confluent
consolidation, pleural effusion, or pneumothorax. No acute osseous
abnormalities are seen.
IMPRESSION: Subsegmental opacity at the right lung base, typically atelectasis,
may represent pneumonia in the setting of COVID infection.

## 2021-03-27 IMAGING — DX DG CHEST 1V PORT
1 series · 1 of 1 positions shown · non-contrast
Comparison: 03/31/2019

CLINICAL DATA: [id number] positivity

EXAM:
PORTABLE CHEST 1 VIEW

[chest]
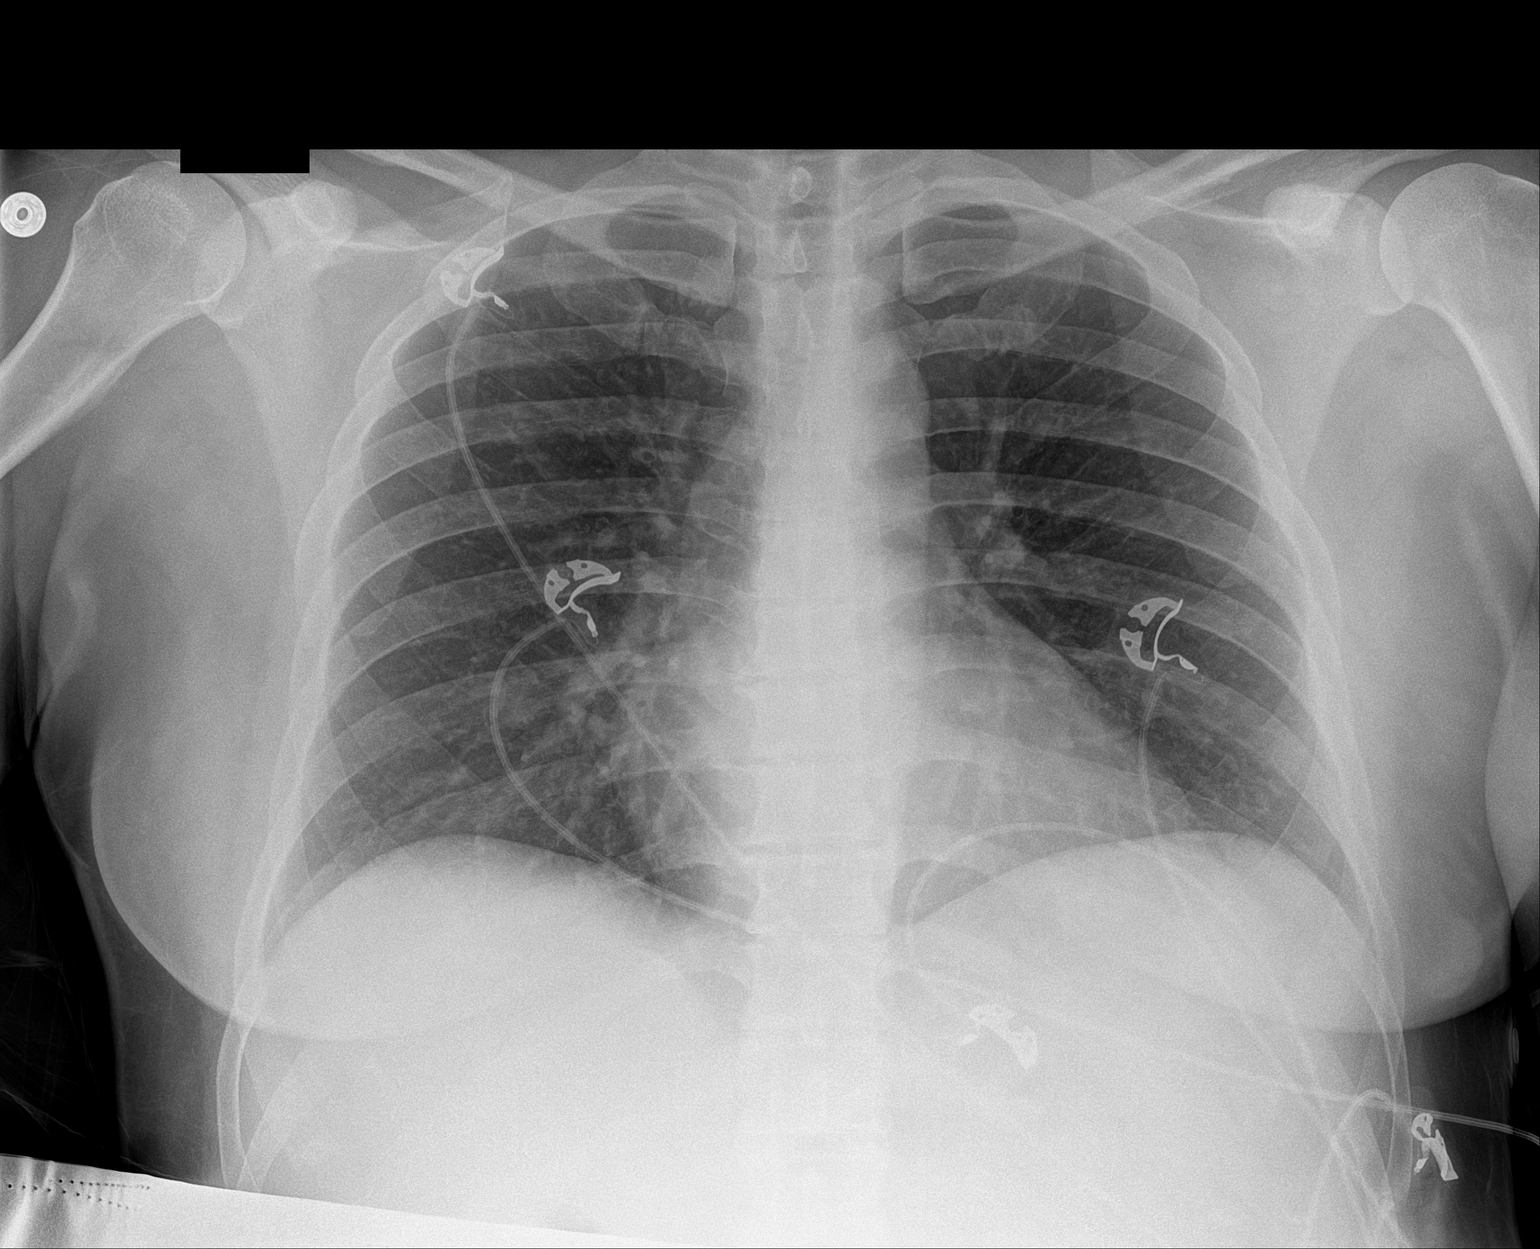

[1 of 1 positions shown; findings below may reference images not displayed]

FINDINGS: Cardiac shadow is within normal limits. The lungs are well aerated
bilaterally. No focal infiltrate is seen. No bony abnormality is
noted.
IMPRESSION: No acute abnormality noted.

## 2021-04-01 IMAGING — DX DG CHEST 1V PORT
1 series · 1 of 1 positions shown · non-contrast
Comparison: Five days ago

CLINICAL DATA: Pneumonia.  COVID.

EXAM:
PORTABLE CHEST 1 VIEW

[chest ap]
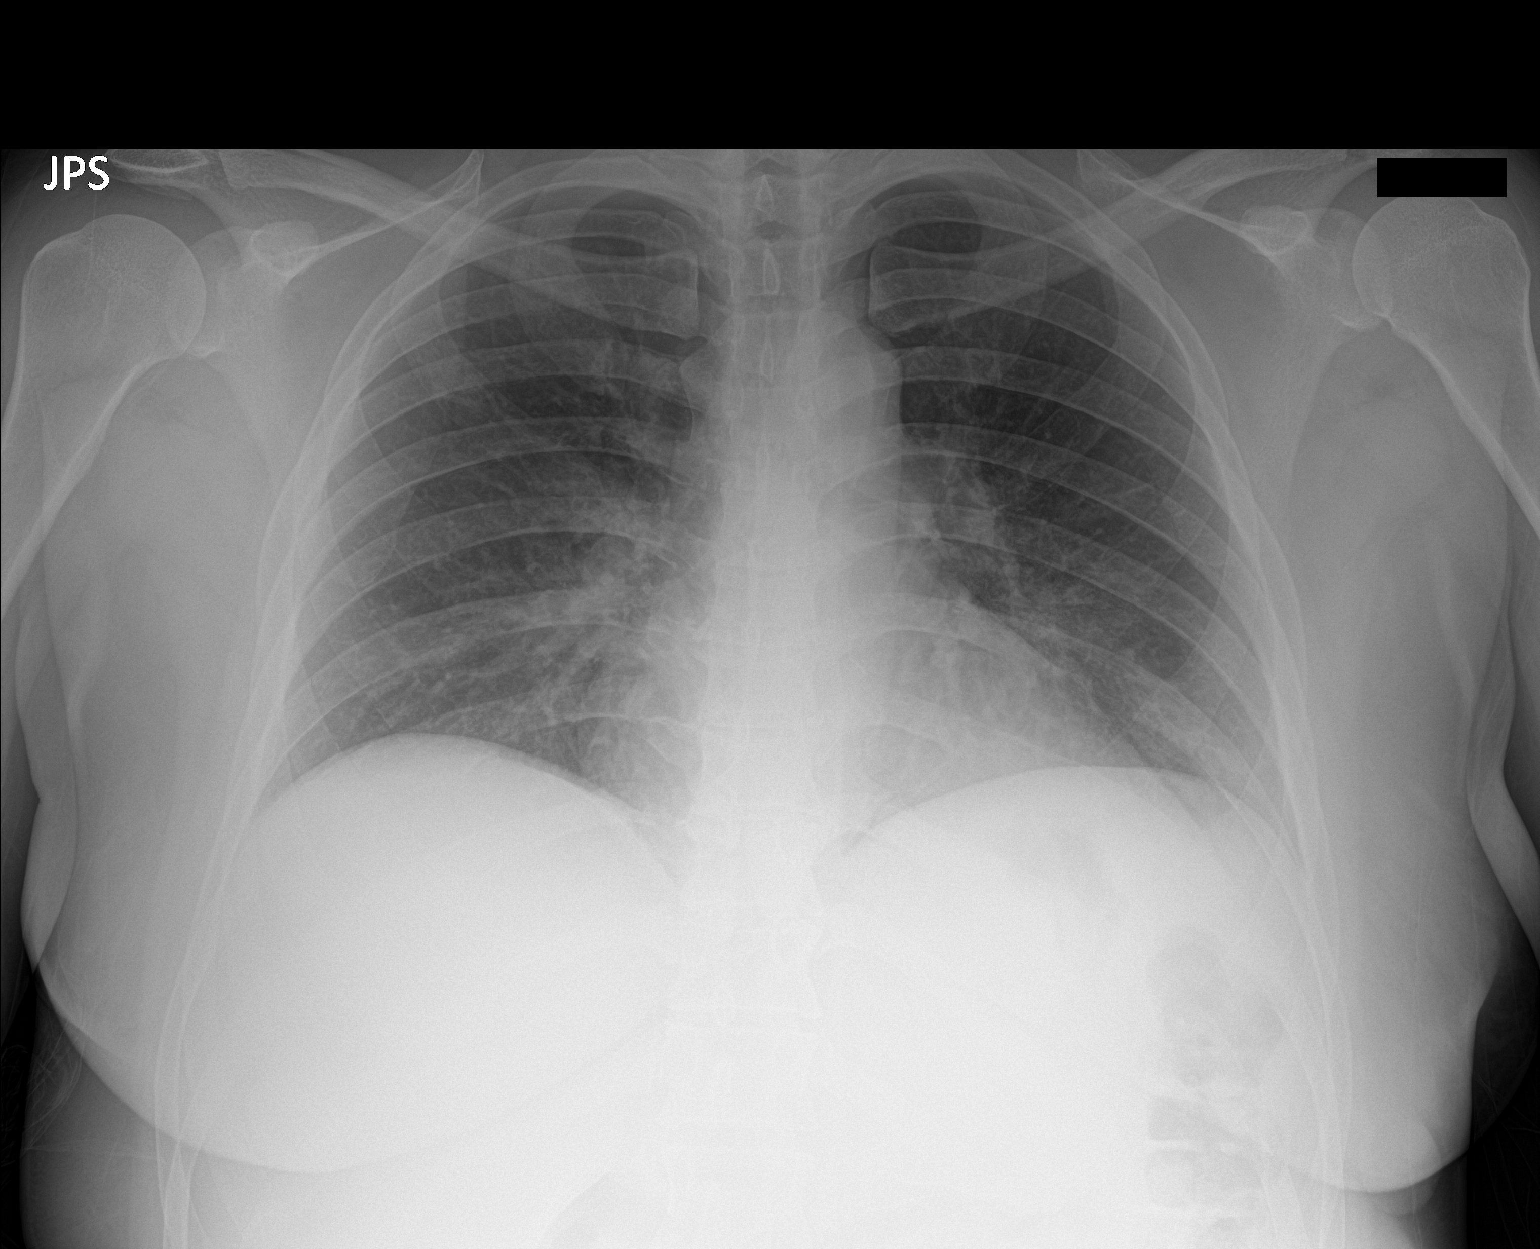

[1 of 1 positions shown; findings below may reference images not displayed]

FINDINGS: Low volume chest with suspected subtle infiltrates about the right
hilum and peripheral left base. No edema, effusion, or pneumothorax.
Normal heart size and mediastinal contours.
IMPRESSION: Low volume chest with concern for early bilateral infiltrates.

## 2022-12-19 ENCOUNTER — Ambulatory Visit
Admission: RE | Admit: 2022-12-19 | Discharge: 2022-12-19 | Disposition: A | Discharge status: Home / Self Care | Payer: Medicaid Other | Source: Ambulatory Visit | Attending: Family Medicine | Admitting: Family Medicine

## 2022-12-19 VITALS — BP 98/51 | HR 78 | Temp 98.0°F | Resp 16

## 2022-12-19 DIAGNOSIS — M5442 Lumbago with sciatica, left side: Secondary | ICD-10-CM | POA: Insufficient documentation

## 2022-12-19 DIAGNOSIS — R319 Hematuria, unspecified: Secondary | ICD-10-CM | POA: Insufficient documentation

## 2022-12-19 DIAGNOSIS — M5441 Lumbago with sciatica, right side: Secondary | ICD-10-CM | POA: Diagnosis not present

## 2022-12-19 LAB — POCT URINALYSIS DIP (MANUAL ENTRY)
Bilirubin, UA: NEGATIVE
Glucose, UA: NEGATIVE mg/dL
Ketones, POC UA: NEGATIVE mg/dL
Leukocytes, UA: NEGATIVE
Nitrite, UA: NEGATIVE
Spec Grav, UA: >=1.03 — AB (ref 1.010–1.025)
Urobilinogen, UA: 1 U/dL
pH, UA: 6.5 (ref 5.0–8.0)

## 2022-12-19 LAB — POCT URINE PREGNANCY: Preg Test, Ur: NEGATIVE

## 2022-12-19 MED ORDER — CYCLOBENZAPRINE HCL 10 MG PO TABS: 10.0000 mg | ORAL_TABLET | Freq: Two times a day (BID) | ORAL | 0 refills | Status: AC | PRN

## 2022-12-19 MED ORDER — KETOROLAC TROMETHAMINE 30 MG/ML IJ SOLN
30.0000 mg | Freq: Once | INTRAMUSCULAR | Status: AC
  Administered 2022-12-19: 30 mg via INTRAMUSCULAR

## 2022-12-19 MED ORDER — DEXAMETHASONE SODIUM PHOSPHATE 10 MG/ML IJ SOLN
10.0000 mg | Freq: Once | INTRAMUSCULAR | Status: AC
  Administered 2022-12-19: 10 mg via INTRAMUSCULAR

## 2022-12-19 NOTE — Discharge Instructions (Addendum)
Treating you for acute low back pain with sciatica- you received both a steroid injection and Toradol injection (antiinflammatory). Do not take ibuprofen, aspirin or any NSAID products for the next 12 hours as she were given a Toradol injection.  You may take the prescribed muscle relaxer cyclobenzaprine as directed for back pain and Tylenol is safe to take with that medication.  Symptoms worsen or do not improve return for evaluation. Given your urinary symptoms and the urine analysis did present in your urine I am culturing her urine to make sure that you do not have any evolving urinary tract infection.  We will notify you via my chart if any treatment is needed.  If the urine culture is normal our office will not reach out.

## 2022-12-19 NOTE — ED Provider Notes (Signed)
[facility]    CSN: [id number] Arrival date & time: 12/19/22  1426      History   Chief Complaint Chief Complaint  Patient presents with   Back Pain    HPI Leah Fernandez is a 47 y.o. female.   HPI Patient presents today with low back pain which started 3 weeks ago and is now radiating down to her lower extremities.  Reports pain is exacerbated by sitting, lying while riding in her vehicle.  Patient has had intermittent back pain for several years and reports a distant injury to her back after lifting something very heavy. She has taken both Tylenol and ibuprofen without relief of symptoms.  Patient endorses pain is worse with positional changes and with prolonged sitting.  Denies any numbness, tingling or loss of sensation in lower extremities.  She endorses seeing symptoms cannot blood in her urine.  Her last menstrual period was 12/05/2022. Denies any symptoms of dysuria. Past Medical History:  Diagnosis Date   Asthma    Cervix dysplasia     Patient Active Problem List   Diagnosis Date Noted   Productive cough    Hypoxia    Antepartum non-reassuring fetal heart rate or rhythm affecting care of mother 04/05/2019   Lab test positive for detection of COVID-19 virus 03/31/2019   COVID-19 affecting pregnancy in third trimester 03/31/2019   Grand multiparity 11/04/2018   AMA (advanced maternal age) multigravida 35+ 11/03/2018   Supervision of high risk pregnancy, antepartum 11/03/2018   History of LEEP (loop electrosurgical excision procedure) of cervix complicating pregnancy 11/03/2018   Language barrier 11/03/2018   CIN II (cervical intraepithelial neoplasia II) 07/29/2013    Past Surgical History:  Procedure Laterality Date   LEEP      OB History     Gravida  6   Para  6   Term  6   Preterm  0   AB  0   Living  6      SAB  0   IAB  0   Ectopic  0   Multiple  0   Live Births  6            Home Medications    Prior to  Admission medications   Medication Sig Start Date End Date Taking? Authorizing Provider  acetaminophen (TYLENOL) 500 MG tablet Take 1 tablet (500 mg total) by mouth every 6 (six) hours as needed. Patient not taking: Reported on 05/05/2019 04/08/19   Joselyn Arrow, MD  cyclobenzaprine (FLEXERIL) 10 MG tablet Take 1 tablet (10 mg total) by mouth 2 (two) times daily as needed for muscle spasms. 12/19/22   Bing Neighbors, NP  Prenatal Vit-Fe Fumarate-FA (PRENATAL MULTIVITAMIN) TABS tablet Take 1 tablet by mouth daily at 12 noon.    [provider]    Family History Family History  Problem Relation Age of Onset   Diabetes Mother    Hypertension Sister    Cancer Sister     Social History Social History   Tobacco Use   Smoking status: Never   Smokeless tobacco: Never  Vaping Use   Vaping status: Never Used  Substance Use Topics   Alcohol use: No   Drug use: No     Allergies   Patient has no known allergies.   Review of Systems Review of Systems Pertinent negatives listed in HPI  Physical Exam Triage Vital Signs ED Triage Vitals  Encounter Vitals Group     BP  Systolic BP Percentile      Diastolic BP Percentile      Pulse      Resp      Temp      Temp src      SpO2      Weight      Height      Head Circumference      Peak Flow      Pain Score      Pain Loc      Pain Education      Exclude from Growth Chart    No data found.  Updated Vital Signs BP (!) 98/51 (BP Location: Left Arm)   Pulse 78   Temp 98 F (36.7 C) (Oral)   Resp 16   LMP 12/05/2022 (Approximate)   SpO2 97%   Breastfeeding Yes   Visual Acuity Right Eye Distance:   Left Eye Distance:   Bilateral Distance:    Right Eye Near:   Left Eye Near:    Bilateral Near:     Physical Exam General appearance: alert, well developed, well nourished, cooperative and in no distress Head: Normocephalic, without obvious abnormality, atraumatic Respiratory: Respirations even and  unlabored, normal respiratory rate Heart:Rate and Rhythm normal.   CVA: None elicited on exam  Back: No vertebral tenderness or asymmetry noted on exam Extremities: No gross deformities Skin: Skin color, texture, turgor normal. No rashes seen  Psych: Appropriate mood and affect.   UC Treatments / Results  Labs (all labs ordered are listed, but only abnormal results are displayed) Labs Reviewed  URINE CULTURE - Abnormal; Notable for the following components:      Result Value   Culture   (*)    Value: <10,000 COLONIES/mL INSIGNIFICANT GROWTH Performed at [facility], [address] [place], [place] [address]    All other components within normal limits  POCT URINALYSIS DIP (MANUAL ENTRY) - Abnormal; Notable for the following components:   Spec Grav, UA >=1.030 (*)    Blood, UA small (*)    Protein Ur, POC trace (*)    All other components within normal limits  POCT URINE PREGNANCY    EKG   Radiology No results found.  Procedures Procedures (including critical care time)  Medications Ordered in UC Medications  dexamethasone (DECADRON) injection 10 mg (10 mg Intramuscular Given 12/19/22 1608)  ketorolac (TORADOL) 30 MG/ML injection 30 mg (30 mg Intramuscular Given 12/19/22 1608)    Initial Impression / Assessment and Plan / UC Course  I have reviewed the triage vital signs and the nursing notes.  Pertinent labs & imaging results that were available during my care of the patient were reviewed by me and considered in my medical decision making (see chart for details).     Patient did have hematuria along with trace protein on UA given symptoms we will culture urine to ensure this is not any evolving UTI.  In the meantime we will treat for uncomplicated acute low back pain with sciatica.  Patient did receive Decadron along with a Toradol injection here in clinic.  Patient will continue home management of symptoms with cyclobenzaprine, heat applications and frequent  positional changes.  Return precautions given if symptoms worsen or do not improve.  Patient aware that we will notify her of any abnormal lab results.  Return as needed. Final Clinical Impressions(s) / UC Diagnoses   Final diagnoses:  Acute midline low back pain with bilateral sciatica  Hematuria, unspecified type  Discharge Instructions      Treating you for acute low back pain with sciatica- you received both a steroid injection and Toradol injection (antiinflammatory). Do not take ibuprofen, aspirin or any NSAID products for the next 12 hours as she were given a Toradol injection.  You may take the prescribed muscle relaxer cyclobenzaprine as directed for back pain and Tylenol is safe to take with that medication.  Symptoms worsen or do not improve return for evaluation. Given your urinary symptoms and the urine analysis did present in your urine I am culturing her urine to make sure that you do not have any evolving urinary tract infection.  We will notify you via my chart if any treatment is needed.  If the urine culture is normal our office will not reach out.     ED Prescriptions     Medication Sig Dispense Auth. Provider   cyclobenzaprine (FLEXERIL) 10 MG tablet Take 1 tablet (10 mg total) by mouth 2 (two) times daily as needed for muscle spasms. 20 tablet Bing Neighbors, NP      PDMP not reviewed this encounter.   Bing Neighbors, NP 12/26/22 [phone]

## 2022-12-19 NOTE — ED Triage Notes (Addendum)
Pt states left lower back pain radiating down her left leg for the past 3 weeks.  States she has been taking Tylenol at home with some relief.

## 2022-12-21 LAB — URINE CULTURE
Culture: NO GROWTH — AB
Special Requests: NORMAL

## 2023-06-05 ENCOUNTER — Ambulatory Visit
